# Patient Record
Sex: Female | Born: 1955 | Race: White | Hispanic: No | Marital: Married | Smoking: Never smoker
Health system: Southern US, Community
[De-identification: ages and names within clinical notes are randomized; demographics above are authoritative.]

## PROBLEM LIST (undated history)

## (undated) DIAGNOSIS — M19072 Primary osteoarthritis, left ankle and foot: Secondary | ICD-10-CM

## (undated) DIAGNOSIS — G4733 Obstructive sleep apnea (adult) (pediatric): Secondary | ICD-10-CM

## (undated) DIAGNOSIS — M1711 Unilateral primary osteoarthritis, right knee: Secondary | ICD-10-CM

## (undated) DIAGNOSIS — H9319 Tinnitus, unspecified ear: Secondary | ICD-10-CM

## (undated) DIAGNOSIS — K589 Irritable bowel syndrome without diarrhea: Secondary | ICD-10-CM

## (undated) DIAGNOSIS — IMO0002 Reserved for concepts with insufficient information to code with codable children: Secondary | ICD-10-CM

## (undated) DIAGNOSIS — G43909 Migraine, unspecified, not intractable, without status migrainosus: Secondary | ICD-10-CM

## (undated) DIAGNOSIS — F419 Anxiety disorder, unspecified: Secondary | ICD-10-CM

## (undated) DIAGNOSIS — I6529 Occlusion and stenosis of unspecified carotid artery: Secondary | ICD-10-CM

## (undated) DIAGNOSIS — H04123 Dry eye syndrome of bilateral lacrimal glands: Secondary | ICD-10-CM

## (undated) DIAGNOSIS — M179 Osteoarthritis of knee, unspecified: Secondary | ICD-10-CM

## (undated) DIAGNOSIS — H66003 Acute suppurative otitis media without spontaneous rupture of ear drum, bilateral: Secondary | ICD-10-CM

## (undated) DIAGNOSIS — U071 COVID-19: Secondary | ICD-10-CM

## (undated) DIAGNOSIS — J302 Other seasonal allergic rhinitis: Secondary | ICD-10-CM

## (undated) DIAGNOSIS — M1990 Unspecified osteoarthritis, unspecified site: Secondary | ICD-10-CM

## (undated) DIAGNOSIS — K219 Gastro-esophageal reflux disease without esophagitis: Secondary | ICD-10-CM

## (undated) DIAGNOSIS — D649 Anemia, unspecified: Secondary | ICD-10-CM

## (undated) DIAGNOSIS — Z973 Presence of spectacles and contact lenses: Secondary | ICD-10-CM

## (undated) DIAGNOSIS — Z853 Personal history of malignant neoplasm of breast: Secondary | ICD-10-CM

## (undated) DIAGNOSIS — I773 Arterial fibromuscular dysplasia: Secondary | ICD-10-CM

## (undated) DIAGNOSIS — F439 Reaction to severe stress, unspecified: Secondary | ICD-10-CM

## (undated) DIAGNOSIS — E7800 Pure hypercholesterolemia, unspecified: Secondary | ICD-10-CM

## (undated) DIAGNOSIS — R112 Nausea with vomiting, unspecified: Secondary | ICD-10-CM

## (undated) DIAGNOSIS — S39012A Strain of muscle, fascia and tendon of lower back, initial encounter: Secondary | ICD-10-CM

## (undated) DIAGNOSIS — Q231 Congenital insufficiency of aortic valve: Secondary | ICD-10-CM

## (undated) DIAGNOSIS — Z923 Personal history of irradiation: Secondary | ICD-10-CM

## (undated) DIAGNOSIS — I7771 Dissection of carotid artery: Secondary | ICD-10-CM

## (undated) DIAGNOSIS — B009 Herpesviral infection, unspecified: Secondary | ICD-10-CM

## (undated) DIAGNOSIS — I77810 Thoracic aortic ectasia: Secondary | ICD-10-CM

## (undated) DIAGNOSIS — H6690 Otitis media, unspecified, unspecified ear: Secondary | ICD-10-CM

## (undated) DIAGNOSIS — I10 Essential (primary) hypertension: Secondary | ICD-10-CM

## (undated) DIAGNOSIS — IMO0001 Reserved for inherently not codable concepts without codable children: Secondary | ICD-10-CM

## (undated) DIAGNOSIS — C50919 Malignant neoplasm of unspecified site of unspecified female breast: Secondary | ICD-10-CM

## (undated) DIAGNOSIS — Q2381 Bicuspid aortic valve: Secondary | ICD-10-CM

## (undated) DIAGNOSIS — Z9889 Other specified postprocedural states: Secondary | ICD-10-CM

## (undated) HISTORY — DX: Unspecified osteoarthritis, unspecified site: M19.90

## (undated) HISTORY — DX: Dry eye syndrome of bilateral lacrimal glands: H04.123

## (undated) HISTORY — DX: Personal history of malignant neoplasm of breast: Z85.3

## (undated) HISTORY — DX: Congenital insufficiency of aortic valve: Q23.1

## (undated) HISTORY — PX: WISDOM TOOTH EXTRACTION: SHX21

## (undated) HISTORY — DX: Migraine, unspecified, not intractable, without status migrainosus: G43.909

## (undated) HISTORY — DX: COVID-19: U07.1

## (undated) HISTORY — DX: Irritable bowel syndrome, unspecified: K58.9

## (undated) HISTORY — DX: Primary osteoarthritis, left ankle and foot: M19.072

## (undated) HISTORY — DX: Bicuspid aortic valve: Q23.81

## (undated) HISTORY — DX: Obstructive sleep apnea (adult) (pediatric): G47.33

## (undated) HISTORY — DX: Anxiety disorder, unspecified: F41.9

## (undated) HISTORY — DX: Reserved for concepts with insufficient information to code with codable children: IMO0002

## (undated) HISTORY — DX: Otitis media, unspecified, unspecified ear: H66.90

## (undated) HISTORY — DX: Other seasonal allergic rhinitis: J30.2

## (undated) HISTORY — DX: Strain of muscle, fascia and tendon of lower back, initial encounter: S39.012A

## (undated) HISTORY — DX: Herpesviral infection, unspecified: B00.9

## (undated) HISTORY — DX: Arterial fibromuscular dysplasia: I77.3

## (undated) HISTORY — DX: Gastro-esophageal reflux disease without esophagitis: K21.9

## (undated) HISTORY — DX: Reaction to severe stress, unspecified: F43.9

## (undated) HISTORY — DX: Tinnitus, unspecified ear: H93.19

## (undated) HISTORY — DX: Pure hypercholesterolemia, unspecified: E78.00

## (undated) HISTORY — DX: Acute suppurative otitis media without spontaneous rupture of ear drum, bilateral: H66.003

## (undated) HISTORY — DX: Osteoarthritis of knee, unspecified: M17.9

## (undated) HISTORY — DX: Unilateral primary osteoarthritis, right knee: M17.11

## (undated) HISTORY — DX: Anemia, unspecified: D64.9

## (undated) HISTORY — DX: Essential (primary) hypertension: I10

## (undated) HISTORY — DX: Reserved for inherently not codable concepts without codable children: IMO0001

## (undated) HISTORY — DX: Thoracic aortic ectasia: I77.810

## (undated) HISTORY — PX: BACK SURGERY: SHX140

## (undated) HISTORY — PX: NASAL POLYP SURGERY: SHX186

## (undated) HISTORY — DX: Dissection of carotid artery: I77.71

## (undated) HISTORY — DX: Malignant neoplasm of unspecified site of unspecified female breast: C50.919

---

## 1998-11-12 ENCOUNTER — Other Ambulatory Visit: Admission: RE | Admit: 1998-11-12 | Discharge: 1998-11-12 | Payer: Self-pay | Admitting: Obstetrics & Gynecology

## 1999-12-23 ENCOUNTER — Other Ambulatory Visit: Admission: RE | Admit: 1999-12-23 | Discharge: 1999-12-23 | Payer: Self-pay | Admitting: Obstetrics & Gynecology

## 2000-08-27 ENCOUNTER — Emergency Department (HOSPITAL_COMMUNITY): Admission: EM | Admit: 2000-08-27 | Discharge: 2000-08-27 | Payer: Self-pay | Admitting: *Deleted

## 2000-09-08 ENCOUNTER — Encounter: Admission: RE | Admit: 2000-09-08 | Discharge: 2000-10-18 | Payer: Self-pay | Admitting: Family Medicine

## 2000-11-06 ENCOUNTER — Ambulatory Visit (HOSPITAL_COMMUNITY): Admission: RE | Admit: 2000-11-06 | Discharge: 2000-11-06 | Payer: Self-pay | Admitting: Neurosurgery

## 2000-11-06 ENCOUNTER — Encounter: Payer: Self-pay | Admitting: Neurosurgery

## 2001-05-10 ENCOUNTER — Other Ambulatory Visit: Admission: RE | Admit: 2001-05-10 | Discharge: 2001-05-10 | Payer: Self-pay | Admitting: Obstetrics & Gynecology

## 2001-12-25 ENCOUNTER — Encounter: Payer: Self-pay | Admitting: Obstetrics & Gynecology

## 2001-12-25 ENCOUNTER — Encounter: Admission: RE | Admit: 2001-12-25 | Discharge: 2001-12-25 | Payer: Self-pay | Admitting: Obstetrics & Gynecology

## 2002-03-08 ENCOUNTER — Encounter: Payer: Self-pay | Admitting: Neurology

## 2002-03-08 ENCOUNTER — Ambulatory Visit (HOSPITAL_COMMUNITY): Admission: RE | Admit: 2002-03-08 | Discharge: 2002-03-08 | Payer: Self-pay | Admitting: Neurology

## 2002-03-13 ENCOUNTER — Inpatient Hospital Stay (HOSPITAL_COMMUNITY): Admission: AD | Admit: 2002-03-13 | Discharge: 2002-03-18 | Payer: Self-pay | Admitting: Neurology

## 2002-03-13 ENCOUNTER — Encounter: Payer: Self-pay | Admitting: Neurology

## 2002-04-08 ENCOUNTER — Ambulatory Visit (HOSPITAL_COMMUNITY): Admission: RE | Admit: 2002-04-08 | Discharge: 2002-04-08 | Payer: Self-pay | Admitting: Neurology

## 2002-04-08 ENCOUNTER — Encounter: Payer: Self-pay | Admitting: Neurology

## 2002-05-20 ENCOUNTER — Encounter: Payer: Self-pay | Admitting: Neurology

## 2002-05-20 ENCOUNTER — Ambulatory Visit (HOSPITAL_COMMUNITY): Admission: RE | Admit: 2002-05-20 | Discharge: 2002-05-20 | Payer: Self-pay | Admitting: Neurology

## 2002-06-16 ENCOUNTER — Emergency Department (HOSPITAL_COMMUNITY): Admission: EM | Admit: 2002-06-16 | Discharge: 2002-06-17 | Payer: Self-pay | Admitting: Emergency Medicine

## 2002-08-19 ENCOUNTER — Ambulatory Visit (HOSPITAL_COMMUNITY): Admission: RE | Admit: 2002-08-19 | Discharge: 2002-08-19 | Payer: Self-pay | Admitting: Neurology

## 2003-02-05 ENCOUNTER — Other Ambulatory Visit: Admission: RE | Admit: 2003-02-05 | Discharge: 2003-02-05 | Payer: Self-pay | Admitting: Obstetrics & Gynecology

## 2003-02-28 ENCOUNTER — Ambulatory Visit (HOSPITAL_COMMUNITY): Admission: RE | Admit: 2003-02-28 | Discharge: 2003-02-28 | Payer: Self-pay | Admitting: Interventional Radiology

## 2003-07-23 ENCOUNTER — Encounter: Admission: RE | Admit: 2003-07-23 | Discharge: 2003-07-23 | Payer: Self-pay | Admitting: Obstetrics & Gynecology

## 2003-11-19 ENCOUNTER — Ambulatory Visit (HOSPITAL_COMMUNITY): Admission: RE | Admit: 2003-11-19 | Discharge: 2003-11-19 | Payer: Self-pay | Admitting: Internal Medicine

## 2003-12-12 ENCOUNTER — Ambulatory Visit (HOSPITAL_COMMUNITY): Admission: RE | Admit: 2003-12-12 | Discharge: 2003-12-12 | Payer: Self-pay | Admitting: Internal Medicine

## 2004-07-28 ENCOUNTER — Other Ambulatory Visit: Admission: RE | Admit: 2004-07-28 | Discharge: 2004-07-28 | Payer: Self-pay | Admitting: Obstetrics & Gynecology

## 2004-08-04 ENCOUNTER — Encounter: Admission: RE | Admit: 2004-08-04 | Discharge: 2004-08-04 | Payer: Self-pay | Admitting: Obstetrics & Gynecology

## 2004-08-06 ENCOUNTER — Encounter: Admission: RE | Admit: 2004-08-06 | Discharge: 2004-08-06 | Payer: Self-pay | Admitting: Internal Medicine

## 2004-12-11 ENCOUNTER — Ambulatory Visit (HOSPITAL_COMMUNITY): Admission: RE | Admit: 2004-12-11 | Discharge: 2004-12-11 | Payer: Self-pay | Admitting: Interventional Radiology

## 2005-07-04 HISTORY — PX: COLONOSCOPY: SHX174

## 2005-10-04 ENCOUNTER — Encounter: Admission: RE | Admit: 2005-10-04 | Discharge: 2005-10-04 | Payer: Self-pay | Admitting: Obstetrics & Gynecology

## 2006-02-01 ENCOUNTER — Encounter: Admission: RE | Admit: 2006-02-01 | Discharge: 2006-02-01 | Payer: Self-pay | Admitting: Interventional Radiology

## 2007-01-08 ENCOUNTER — Encounter: Admission: RE | Admit: 2007-01-08 | Discharge: 2007-01-08 | Payer: Self-pay | Admitting: Obstetrics & Gynecology

## 2008-01-23 ENCOUNTER — Encounter: Admission: RE | Admit: 2008-01-23 | Discharge: 2008-01-23 | Payer: Self-pay | Admitting: Obstetrics & Gynecology

## 2008-02-04 ENCOUNTER — Encounter: Admission: RE | Admit: 2008-02-04 | Discharge: 2008-02-04 | Payer: Self-pay | Admitting: Obstetrics & Gynecology

## 2009-04-23 ENCOUNTER — Encounter: Admission: RE | Admit: 2009-04-23 | Discharge: 2009-04-23 | Payer: Self-pay | Admitting: Obstetrics & Gynecology

## 2010-05-14 ENCOUNTER — Encounter: Admission: RE | Admit: 2010-05-14 | Discharge: 2010-05-14 | Payer: Self-pay | Admitting: Obstetrics & Gynecology

## 2010-07-25 ENCOUNTER — Encounter: Payer: Self-pay | Admitting: Interventional Radiology

## 2010-07-26 ENCOUNTER — Encounter: Payer: Self-pay | Admitting: Obstetrics & Gynecology

## 2010-11-19 NOTE — Op Note (Signed)
[place]  Patient:    [name], [name]                       MRN: [id number] Proc. Date: 11/06/00 Adm. Date:  [id number] Attending:  Donn Pierini                           Operative Report  PREOPERATIVE DIAGNOSIS:  Left L2-3 extraforaminal herniated nucleus pulposus with radiculopathy.  POSTOPERATIVE DIAGNOSIS:  Left L2-3 extraforaminal herniated nucleus pulposus with radiculopathy.  OPERATION:   Left L2-3 extraforaminal microdiskectomy.  SURGEON:  Julio Sicks, M.D.  ASSISTANT:  Reinaldo Meeker, M.D.  ANESTHESIA:  General endotracheal anesthesia.  INDICATIONS:  Ms. Skop is a 55 year old female with history of back and left lower extremity pain consistent with a left-sided L2 radiculopathy.  The patient has failed efforts at conservative management.  She has evidence of weakness and numbness and left-sided L2 radicular pattern.   MRI scanning demonstrates a left-sided L2-3 extraforaminal disk herniation with compression of the left-sided L2 nerve root.  The patient has been counselled as to her options.  She has decided to proceed with a left-sided L2-3 extraforaminal microdiskectomy for hopeful relief of her symptoms.  DESCRIPTION OF PROCEDURE:  The patient was taken to the operating room and placed on operating table in supine position.  After an adequate level of anesthesia was achieved, the patient was turned prone onto Wilson frame and padded adequately.  The patients lumbar region is shaved and prepped sterilely.   A #10 blade is used to make a linear incision overlying the L2-3 interspace.  The skin is entered sharply in the midline.  A subperiosteal dissection was performed on the left side exposing lamina and facet joints of L2 and L3 as well as transverse process at L2.  A deep self-retaining retractor was placed.  X-rays taken and level was confirmed.  An extraforaminal approach was then performed using high-speed drill to  remove the lateral aspect of the inferior facet of L2 and the superior facet of L3. Intertransverse ligament was then elevated and resected in piecemeal fashion. The underlying L2 nerve root was identified.  The operating microscope was brought into the field and used for microdissection of the left-sided L2 nerve root and underlying disk herniation.   Paraneural fat was then coagulated and cut.  The L2 nerve root was gradually mobilized superiorly, and a large amount of free disk herniation was identified laterally.  This was dissected free using blunt hooks.  This was then removed using pituitary rongeurs.  A very thorough microdiskectomy was carried out at this point.  There was no evidence of any continued compression.  The wound was then copiously irrigated with antibiotic solution.  Gelfoam was placed topically for hemostasis which was found to be good.  The wound was then closed in layers with Vicryl sutures. Steri-Strips and sterile dressing were applied.  There were no intraoperative complications.  The patient tolerated the procedure well, and she returned to the recovery room postoperatively. DD:  11/06/00 TD:  11/06/00 Job: [id number]

## 2010-11-19 NOTE — Discharge Summary (Signed)
[name], [name]                          ACCOUNT NO.:  [id number]   MEDICAL RECORD NO.:  [id number]                   PATIENT TYPE:  INP   LOCATION:  3001                                 FACILITY:  [facility]   PHYSICIAN:  Brianna Peer. Polite, MD                DATE OF BIRTH:  August 12, 1955   DATE OF ADMISSION:  03/13/2002  DATE OF DISCHARGE:  03/18/2002                                 DISCHARGE SUMMARY   DISCHARGE DIAGNOSES:  1. 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  2. Chronic headaches.  3. Hyperlipidemia.  4. Decreased vision without field deficit.  5. History of back surgery.   DISCHARGE MEDICATIONS:  1. Zocor 20 mg at bedtime.  2. Celebrex 200 mg daily.  3. Coumadin 7.5 mg daily.  4. Protonix 40 mg daily.  5. Xanax 0.5 mg three times a day as needed for anxiety.  6. Darvocet-N 100 one to two times every four hours as needed for pain.   CONSULTATIONS:  1. Dr. Porfirio Mylar Torres, neurology.  2. Dr. Grandville Silos. Torres, interventional radiologist.   PROCEDURES AND STUDIES:  1. Cerebral arteriogram which was done on September 10th.  Results reveals:     1) Severe segmental stenosis, smooth in nature, involving the distal     right internal carotid artery just proximal to the petrous-cervical     junction.  This is highly suspicious of relatively recent dissection,     correlating with the patient's symptoms.  2)  No evidence of intraluminal     filling defects to suggest clot formation in this region, although there     is reduced hemodynamic flow through this vessel.  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  4)  Obfuscation of the     right anterior communicating artery distribution via the anterior     communicating artery  from the left internal carotid artery. 5)     Approximately 85% stenosis of the left internal carotid artery just     proximal to the petrous-cervical junction associated with a moderate-     sized pseudoaneurysm; this finding is most compatible with a dissection,     also probably slightly older than the one on the right.  6)  Incidental     note is made of focal stenosis of 50% of the right vertebral artery at     the cranial skull base.  7)  The finding in the internal carotid artery     distally at the cranial skull base and the right vertebral artery raise     the possibility of an underlying fibromuscular dysplasia.  2. She had an electrocardiogram, September  10th:  Normal sinus rhythm,     ventricular rate 60.   LABORATORY DATA:  Admission CBC with a hemoglobin of 13.6, hematocrit 40.2,  otherwise, within normal limits.  ESR was 7.  Chemistries with a glucose of  114, ALT 51, otherwise, within normal limits.  Lipid profile:  Total  cholesterol 210, triglycerides 199.  TSH 1.478.  Discharge CBC with a WBC  count of 6.9, RBC 4.47, hemoglobin 13.1, hematocrit 39.9.  ANA was pending.   DISPOSITION:  The patient will be discharged home.   HISTORY OF PRESENT ILLNESS:  This is a 55 year old female who is followed in  primary care by Dr. Caryn Bee C. Torres.  The patient has had chronic  headaches refractory to conservative treatment.  She was referred to Dr.  Rene Torres of the headache and neck pain clinic for evaluation.  Neurological exam revealed cranial bruits.  The patient was sent to [facility] for a cerebral angiogram on March 13, 2002, results revealing  bilateral carotid stenoses, questionable pseudoaneurysms/dissection.  The  patient was admitted for further evaluation and treatment.   HOSPITAL COURSE:  1. BILATERAL INTERNAL CAROTID ARTERY STENOSES, QUESTIONABLE PSEUDOANEURYSM     OR QUESTIONABLE DISSECTION:  The patient was admitted, started on IV     heparin and  p.o. Coumadin.  She was followed by the interventional     radiologist.  The pharmacy followed her PT/INRs.  She continued to     complains of headaches that were treated with analgesics.  The patient     did not display any neurological deficits.  A neurological consult was     made.  She was seen on March 17, 2002 by Dr. 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  Recommendations were continue     anticoagulants, with a goal INR to 2 to 3, to treat her headaches with     anti-inflammatory medications.  The patient should not have any triptans     as vasospastic agents could cause the patient to suffer a CVA, future     recommendations for possible stent in the intracerebral artery, should     there be a persistent narrowing.  She is to have a repeat angiogram on     October 6th by interventional radiology and she is to follow up with Dr.     Porfirio Mylar Torres on an outpatient basis.   1. CHRONIC HEADACHES:  These were managed with p.o. analgesics.  The patient     is being discharged on p.o. analgesics.  Further treatment will be left     to primary care physician and/or a neurologist.   1. HYPERLIPIDEMIA:  Due to vascular disease, the patient was started on     Zocor.  Further treatment and management will be left to her primary care     physician.   1. DIMINISHED VISION WITHOUT VISUAL FIELD DEFECT:  The patient is to follow     up with Dr. Molly Maduro L. Torres for ophthalmologic evaluation.   1. ANTICOAGULANT THERAPY:  The patient should have a PT/INR at her next     office visit with Dr. Sydnee Torres on September 29th.   FOLLOWUP:  Again, the patient is to have a repeat angiogram, October 6th,  and she was to follow up within three weeks with Dr.  Porfirio Mylar Torres, along with an ophthalmologic evaluation by Dr. Dione Torres.  She  has an appointment with  her primary care physician, Dr. Sydnee Torres, on September 29th.  An update was  left for Dr. Sydnee Torres on his office voice mail.     Stephanie Swaziland, NP                      Brianna Peer. Polite, MD    SJ/MEDQ  D:  03/18/2002  T:  03/19/2002  Job:  [id number]   cc:   Lilyan Punt. [name], M.D.   Melvyn Novas, M.D.  [address]  [place]  [place] [address]  Fax: [phone]   Brianna Silos. Corliss Skains, M.D.  [address]  [place]  [place]  [address]  Fax: [phone]   [name], M.D.  [address]  [place]  [place] [address]  Fax: [phone]

## 2010-11-19 NOTE — Consult Note (Signed)
NAME:  [name], [name]                          ACCOUNT NO.:  [id number]   MEDICAL RECORD NO.:  [id number]                   PATIENT TYPE:  INP   LOCATION:  3001                                 FACILITY:  [facility]   PHYSICIAN:  Melvyn Novas, M.D.               DATE OF BIRTH:  [birth year]   DATE OF CONSULTATION:  03/17/2002  DATE OF DISCHARGE:                                   CONSULTATION   NEUROLOGY CONSULTATION   REASON FOR CONSULTATION:  The patient is admitted to Room 3001 at [facility].  This 55 year old Caucasian right-handed female has no significant  past medical history except for an L2-3 disk prolapse and diskectomy surgery  in 2002 which had temporarily effected the strength and sensory in her left  leg.  She has a history of juvenile arrhythmia that was treated with  Tenormin throughout her early 39s and during her first of three pregnancies  was discontinued.  She has never required any cardiac arrhythmia medication  again.  She had normal pregnancies and deliveries and denies any gestational  diabetes or hypertension or eclamptic problems.  She has occasional  migraines since teenager and recently developed some tinnitus and recurrent  headaches of a strength and quality that she had not encountered before.  She saw Dr. Ladona Horns as an outpatient headache specialist who arranged for an  angiogram through Dr. Pollyann Savoy, a rheumatologist, and her husband  Kerby Nora, he is an interventional radiologist.  Dr. Corliss Skains was also  contacted because the patient's ENT physician who had worked her up for a  tinnitus has recommended angio as well.  On the angiogram there were  bilateral carotid artery stenoses seen, they appeared to have a thickening  of the wall, no true aneurysm was seen and it was thought that these were  subacute or chronic dissections of the arteries remote in occurrence that  had partially healed that might have been a rather newer  dissective  component to the right internal carotid artery.  Again, tinnitus and  pulsatory are the choice phenomena perceived on left.  The patient then  acutely developed a right-sided blurring of vision and felt that her eyelids  looked abnormal.  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  This is day #4 of her hospital admission, today's INR  was 1.8, and she is supposed to be able to be discharged tomorrow.  She  requested from Dr. Nehemiah Settle to see a neurologist for an evaluation as her  current specialist is not associated with hospital care and she would like  to have a more comprehensive approach.  It was decided that the patient most  likely suffers from fibromuscular dysplasia and she  was worked up for this  condition.   FAMILY HISTORY:  She has a daughter 24 years old with migraines that had  started as teenager.  She had two parents that both suffered from strokes  when over 52 years old, father had hypertension.  The daughter also has a  benign bone tumor, giant cell, of the right wrist and this required surgical  treatment by bone graft.  She also has a peripheral scoliosis.   SOCIAL HISTORY:  The patient is married and first marriage with three  children 51 years old, 30 years old, and 55 years old.  Nonsmoker,  nondrinker.  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   MEDICAL TREATMENT:  Coumadin at the current 7.5 mg, one half tablet.  IV  heparin.  The patient temporarily used Cerebyx prior to her admission for a  brief of time as prescribed by Dr. Corliss Skains 200 mg q.a.m.  She has p.r.n.  Xanax 0.5 to 1 mg for insomnia or anxiety.  She uses Protonix CF 40 mg q.d.  and she has a p.r.n. order for Darvocet 100 mg.   ALLERGIES:  She has no known drug  allergies.   REVIEW OF SYSTEMS:  The patient still complains of headache.  The Horner  symptoms have decreased but there is a feeling of slight puffiness over her  right face and she still has noticed decrease in visual acuity, blurring,  but no visual field defect or decrease in color vision.   PHYSICAL EXAMINATION:  VITAL SIGNS: The patient is afebrile, blood pressure  is 120/70, heart rate 68, temperature 97.  LUNGS: Clear to auscultation.  HEART: Regular rhythm without murmur.  ABDOMEN: Soft, nontender.  There is no peripheral edema.  PULSES: All pulses are palpable.  EXTREMITIES/SKIN: No erythema, depigmentation or hyperpigmentation of  fibrous dermatomal lesions are seen.  NECK: The patient has a full range of motion in the neck.  NEUROLOGICAL: Cranial nerves: The patient has a pupillary reaction to light  only on the left promptly from 4/2 mm, on the right there is an absent  direct light reaction.  I feel that there is a mild consensual reaction but  certainly not intact to the full level.  Accommodation is intact.  The  patient has increasing headaches when accommodation maneuver is performed.  The facial symmetry seems preserved.  Tongue and uvula are midline.  Extraocular movements are fully intact.  The patient can close her eyes,  wrinkle her forehead and smile bilaterally symmetric.  __________ and  Beevor's no lateralization.  The patient adds that she has lost her taste  for sweets with the onset of the Horner and wanted to know if there was a  gustatory component possible which I could verify.  Motor exam 5/5 strength,  mass and tone bilaterally.  Finger-nose bilaterally equally good without  dysmetria, ataxia, or tremor.  Rapid alternating movements are fully intact.  The coordination appears intact.  The patient has intact sensory to touch,  pinprick, vibration,  and temperature.  She has equal DTRs 1+ with upgoing toe only on the left and here she states this might  be a finding from her  lumbar surgery since her left leg was most effected.  She has fully balanced  gait and stance.  There is no nystagmus with rapid movements of the neck.  I  repeated the peripheral vision test and it shows no peripheral vision loss,  again  there is no nystagmus and the extraocular movements are fully intact.  Capacity of the patient to read is intact.  When used binocular/monocular  vision, the right seems to be decreased to 20/100.  I cannot appreciate the  retina but I have no dilatory eye drops available here.  The patient's  pupils is about 2 mm wide.   ASSESSMENT:  Fibromuscular dysplasia with multiple recurrent dissections and  involvement of sympathetic fibers along the carotid glomus, internal carotid  artery and ophthalmic artery.  Cannot clearly explain tinnitus with that.  The patient has no true sign of an old stroke.   PLAN:  Continue INR followup goal rate 2-3.  We would be able to treat the  headaches with anti-inflammatory medications as opposed to steroids here in  the hospital or Celebrex, Vioxx or Bextra which should be started here to  see if it effects the patient's INR and Coumadin management.  For  prophylactic medication, it would be necessary to rule out a vasovascular  headache provoked by the fibromuscular dysplasia and that the headaches  occur more than six times a month which would be the cutoff line to make  preventive medications necessary.  I did not get that story.  I would also  like to remind that the patient should not have any triptans as vasospastic  agents could trip the cause a fibromuscular dysplasia patient to suffer a  stroke.  I would consider continuous Coumadin treatment even for multiple  years to come and perhaps at a later point when it is clear that the disease  has cooled off to place stents in the intracerebral arteries should there  be a persistent narrowing by an organized structure.  I thank Dr. Nehemiah Settle  very  much for this consultation and would like to see the patient in follow  up 2-3 weeks post discharge.  I also recommended strongly to the patient to  see an ophthalmologist for an evaluation of the visual acuity loss and if  she had involvement of macular fibers by ischemia secondary to the  dysplasia.  This should be documented and perhaps a retinal photograph  obtained.                                               Melvyn Novas, M.D.   CD/MEDQ  D:  03/17/2002  T:  03/17/2002  Job:  [id number]

## 2011-04-13 ENCOUNTER — Other Ambulatory Visit: Payer: Self-pay | Admitting: Obstetrics & Gynecology

## 2011-04-13 DIAGNOSIS — Z1231 Encounter for screening mammogram for malignant neoplasm of breast: Secondary | ICD-10-CM

## 2011-05-16 ENCOUNTER — Ambulatory Visit
Admission: RE | Admit: 2011-05-16 | Discharge: 2011-05-16 | Disposition: A | Discharge status: Home / Self Care | Payer: Commercial Indemnity | Source: Ambulatory Visit | Attending: Obstetrics & Gynecology | Admitting: Obstetrics & Gynecology

## 2011-05-16 DIAGNOSIS — Z1231 Encounter for screening mammogram for malignant neoplasm of breast: Secondary | ICD-10-CM

## 2011-05-24 ENCOUNTER — Other Ambulatory Visit: Payer: Self-pay | Admitting: Obstetrics & Gynecology

## 2011-05-24 DIAGNOSIS — R928 Other abnormal and inconclusive findings on diagnostic imaging of breast: Secondary | ICD-10-CM

## 2011-06-07 ENCOUNTER — Ambulatory Visit
Admission: RE | Admit: 2011-06-07 | Discharge: 2011-06-07 | Disposition: A | Discharge status: Home / Self Care | Payer: Commercial Indemnity | Source: Ambulatory Visit | Attending: Obstetrics & Gynecology | Admitting: Obstetrics & Gynecology

## 2011-06-07 DIAGNOSIS — R928 Other abnormal and inconclusive findings on diagnostic imaging of breast: Secondary | ICD-10-CM

## 2012-07-04 DIAGNOSIS — Z923 Personal history of irradiation: Secondary | ICD-10-CM

## 2012-07-04 HISTORY — DX: Personal history of irradiation: Z92.3

## 2012-10-31 ENCOUNTER — Other Ambulatory Visit: Payer: Self-pay

## 2012-10-31 DIAGNOSIS — Z1231 Encounter for screening mammogram for malignant neoplasm of breast: Secondary | ICD-10-CM

## 2012-11-28 ENCOUNTER — Ambulatory Visit
Admission: RE | Admit: 2012-11-28 | Discharge: 2012-11-28 | Disposition: A | Discharge status: Home / Self Care | Payer: Commercial Indemnity | Source: Ambulatory Visit

## 2012-11-28 DIAGNOSIS — Z1231 Encounter for screening mammogram for malignant neoplasm of breast: Secondary | ICD-10-CM

## 2012-11-29 ENCOUNTER — Other Ambulatory Visit: Payer: Self-pay | Admitting: Obstetrics & Gynecology

## 2012-11-29 DIAGNOSIS — R928 Other abnormal and inconclusive findings on diagnostic imaging of breast: Secondary | ICD-10-CM

## 2012-12-07 ENCOUNTER — Ambulatory Visit
Admission: RE | Admit: 2012-12-07 | Discharge: 2012-12-07 | Disposition: A | Discharge status: Home / Self Care | Payer: Commercial Indemnity | Source: Ambulatory Visit | Attending: Obstetrics & Gynecology | Admitting: Obstetrics & Gynecology

## 2012-12-07 ENCOUNTER — Other Ambulatory Visit: Payer: Self-pay | Admitting: Obstetrics & Gynecology

## 2012-12-07 DIAGNOSIS — R928 Other abnormal and inconclusive findings on diagnostic imaging of breast: Secondary | ICD-10-CM

## 2012-12-12 ENCOUNTER — Ambulatory Visit
Admission: RE | Admit: 2012-12-12 | Discharge: 2012-12-12 | Disposition: A | Discharge status: Home / Self Care | Payer: Commercial Indemnity | Source: Ambulatory Visit | Attending: Obstetrics & Gynecology | Admitting: Obstetrics & Gynecology

## 2012-12-12 DIAGNOSIS — R928 Other abnormal and inconclusive findings on diagnostic imaging of breast: Secondary | ICD-10-CM

## 2012-12-13 ENCOUNTER — Other Ambulatory Visit: Payer: Self-pay | Admitting: Obstetrics & Gynecology

## 2012-12-13 DIAGNOSIS — C50912 Malignant neoplasm of unspecified site of left female breast: Secondary | ICD-10-CM

## 2012-12-14 ENCOUNTER — Telehealth: Payer: Self-pay | Admitting: *Deleted

## 2012-12-14 DIAGNOSIS — C50419 Malignant neoplasm of upper-outer quadrant of unspecified female breast: Secondary | ICD-10-CM | POA: Insufficient documentation

## 2012-12-14 DIAGNOSIS — C50412 Malignant neoplasm of upper-outer quadrant of left female breast: Secondary | ICD-10-CM

## 2012-12-14 NOTE — Telephone Encounter (Signed)
Confirmed [facility] for 12/19/12 at 0800.  Instructions and contact information given.

## 2012-12-18 ENCOUNTER — Ambulatory Visit
Admission: RE | Admit: 2012-12-18 | Discharge: 2012-12-18 | Disposition: A | Discharge status: Home / Self Care | Payer: Commercial Indemnity | Source: Ambulatory Visit | Attending: Obstetrics & Gynecology | Admitting: Obstetrics & Gynecology

## 2012-12-18 ENCOUNTER — Other Ambulatory Visit: Payer: Self-pay | Admitting: Obstetrics & Gynecology

## 2012-12-18 DIAGNOSIS — C50912 Malignant neoplasm of unspecified site of left female breast: Secondary | ICD-10-CM

## 2012-12-18 MED ORDER — GADOBENATE DIMEGLUMINE 529 MG/ML IV SOLN
16.0000 mL | Freq: Once | INTRAVENOUS | Status: AC | PRN
  Administered 2012-12-18: 16 mL via INTRAVENOUS

## 2012-12-19 ENCOUNTER — Ambulatory Visit
Admission: RE | Admit: 2012-12-19 | Discharge: 2012-12-19 | Disposition: A | Discharge status: Home / Self Care | Payer: Commercial Indemnity | Source: Ambulatory Visit | Attending: Radiation Oncology | Admitting: Radiation Oncology

## 2012-12-19 ENCOUNTER — Ambulatory Visit: Payer: Commercial Indemnity

## 2012-12-19 ENCOUNTER — Other Ambulatory Visit: Payer: Self-pay | Admitting: Oncology

## 2012-12-19 ENCOUNTER — Ambulatory Visit (HOSPITAL_BASED_OUTPATIENT_CLINIC_OR_DEPARTMENT_OTHER): Payer: Commercial Indemnity | Admitting: Surgery

## 2012-12-19 ENCOUNTER — Telehealth: Payer: Self-pay | Admitting: Oncology

## 2012-12-19 ENCOUNTER — Other Ambulatory Visit (HOSPITAL_BASED_OUTPATIENT_CLINIC_OR_DEPARTMENT_OTHER): Payer: Commercial Indemnity | Admitting: Lab

## 2012-12-19 ENCOUNTER — Ambulatory Visit (HOSPITAL_BASED_OUTPATIENT_CLINIC_OR_DEPARTMENT_OTHER): Payer: Commercial Indemnity | Admitting: Oncology

## 2012-12-19 ENCOUNTER — Encounter: Payer: Self-pay | Admitting: *Deleted

## 2012-12-19 ENCOUNTER — Ambulatory Visit: Payer: Commercial Indemnity | Attending: Surgery | Admitting: Physical Therapy

## 2012-12-19 ENCOUNTER — Encounter: Payer: Self-pay | Admitting: Oncology

## 2012-12-19 ENCOUNTER — Other Ambulatory Visit (INDEPENDENT_AMBULATORY_CARE_PROVIDER_SITE_OTHER): Payer: Self-pay | Admitting: Surgery

## 2012-12-19 VITALS — BP 149/90 | HR 82 | Temp 98.2°F | Resp 20 | Ht 64.5 in | Wt 175.7 lb

## 2012-12-19 DIAGNOSIS — Z01812 Encounter for preprocedural laboratory examination: Secondary | ICD-10-CM | POA: Insufficient documentation

## 2012-12-19 DIAGNOSIS — C50412 Malignant neoplasm of upper-outer quadrant of left female breast: Secondary | ICD-10-CM

## 2012-12-19 DIAGNOSIS — C50419 Malignant neoplasm of upper-outer quadrant of unspecified female breast: Secondary | ICD-10-CM

## 2012-12-19 DIAGNOSIS — C50919 Malignant neoplasm of unspecified site of unspecified female breast: Secondary | ICD-10-CM | POA: Insufficient documentation

## 2012-12-19 DIAGNOSIS — IMO0001 Reserved for inherently not codable concepts without codable children: Secondary | ICD-10-CM | POA: Insufficient documentation

## 2012-12-19 DIAGNOSIS — R293 Abnormal posture: Secondary | ICD-10-CM | POA: Insufficient documentation

## 2012-12-19 DIAGNOSIS — C50912 Malignant neoplasm of unspecified site of left female breast: Secondary | ICD-10-CM

## 2012-12-19 LAB — CBC WITH DIFFERENTIAL/PLATELET
BASO%: 1.9 % (ref 0.0–2.0)
Eosinophils Absolute: 0.1 10*3/uL (ref 0.0–0.5)
HCT: 43 % (ref 34.8–46.6)
HGB: 14.6 g/dL (ref 11.6–15.9)
MCH: 30.1 pg (ref 25.1–34.0)
MONO%: 8.2 % (ref 0.0–14.0)
Platelets: 208 10*3/uL (ref 145–400)
RBC: 4.85 10*6/uL (ref 3.70–5.45)
RDW: 13.4 % (ref 11.2–14.5)

## 2012-12-19 LAB — COMPREHENSIVE METABOLIC PANEL (CC13)
ALT: 25 U/L (ref 0–55)
AST: 18 U/L (ref 5–34)
Albumin: 3.8 g/dL (ref 3.5–5.0)
Alkaline Phosphatase: 71 U/L (ref 40–150)
Glucose: 78 mg/dl (ref 70–99)
Potassium: 3.9 mEq/L (ref 3.5–5.1)
Sodium: 141 mEq/L (ref 136–145)
Total Bilirubin: 0.61 mg/dL (ref 0.20–1.20)
Total Protein: 7.2 g/dL (ref 6.4–8.3)

## 2012-12-19 NOTE — Progress Notes (Signed)
Checked in new patient. No financial issues. She has no POA/living will. She want all communication via email and she will do mchart. I gave her the [facility] form to fill out.

## 2012-12-19 NOTE — Telephone Encounter (Signed)
, °

## 2012-12-19 NOTE — Progress Notes (Addendum)
Re:   [name] DOB:   December 09, 1955 MRN:   [id number]  [facility]  ASSESSMENT AND PLAN: 1.  Left breast cancer - at 1 o'clock  1.5 cm on mammogram, 2.0 cm on MRI  IDC, Grade 2, ER +, PR +, Her2Neu - neg., Ki67 - 10%.  Oncology - Magrinat and Michell Heinrich.  I discussed the options for breast cancer treatment with the patient.  The patient is in the multidisciplinary clinic and understands the treatment of breast cancer includes medical oncology and radiation oncology.  I discussed the surgical options of lumpectomy vs. mastectomy.  If mastectomy, there is the possibility of reconstruction.   I discussed the options of lymph node biopsy.  The treatment plan depends on the pathologic staging of the tumor and the patient's personal wishes.  The risks of surgery include, but are not limited to, bleeding, infection, the need for further surgery, and nerve injury.  Plan: 1) left breast lumpectomy and left SLNBx, 2.)  Oncotype  2.  History of int carotid stenosis (secondary to fibromuscular dysplasia) - she underwent carotid artery dilatation in 2003.  Drs. Dohmeir and Tour manager on chronic aspirin for this  [Okay from Dr. Richardean Chimera to stop aspirin for 5 days prior to surgery.  DN 12/23/2012] 3.  On chronic aspirin for #2.  She is going to check with Dr. Vickey Huger about stopping the aspirin in anticipation of surgery 4.  Recent rectal bleeding - secondary to hemorrhoids.  Will check in the office - did not have the right supplies at the [facility]. 5.  Hypercholesterolemia   REFERRING PHYSICIAN: [name], MD  HISTORY OF PRESENT ILLNESS: [name] is a 57 y.o. (DOB: [birth year])  white  female whose primary care physician is Brianna Mountain, MD and comes to [facility] for a new left breast cancer.  The patient went for her routine mammogram.  Her last mammogram was about 15 months ago.  The mammogram at [facility] showed a 1.5 x 1.3 cm mass at the 1 o'clock position.  A  core biopsy showed an Invasive Ductal Carcinoma.  A breast MRI on 12/18/2012 showed a 2.0 x 1.4 cm mass in the UOQ of the left breast. She has a cousin who had breast cancer, but no first degree relative.  She went 9 months without a period, then recently had a small period.  She has seen Dr. Rosealee Torres for this.  He is checking some blood work to see if she has gone through menopause.  She is not on hormone meds.   No past medical history on file.    Past Surgical History  Procedure Laterality Date  . Back surgery        Current Outpatient Prescriptions  Medication Sig Dispense Refill  . aspirin 81 MG tablet Take 81 mg by mouth daily.      Marland Kitchen atorvastatin (LIPITOR) 10 MG tablet Take 10 mg by mouth daily.      . Calcium Carbonate-Vitamin D (CALCIUM + D PO) Take by mouth daily.      . citalopram (CELEXA) 10 MG tablet Take 10 mg by mouth daily.      . Fluticasone Propionate (FLONASE NA) Place into the nose.      . Multiple Vitamins-Minerals (CENTRUM SILVER ADULT 50+ PO) Take by mouth.       No current facility-administered medications for this visit.     No Known Allergies  REVIEW OF SYSTEMS: Skin:  No history of rash.  No history of abnormal moles. Infection:  No history of hepatitis or HIV.  No history of MRSA. Neurologic:  Prior carotid artery dilatation around 2002.  Followed by Dr. Vickey Huger. Cardiac:  No history of hypertension. No history of heart disease.  No history of seeing a cardiologist. Pulmonary:  Does not smoke cigarettes.  No asthma or bronchitis.  No OSA/CPAP.  Endocrine:  No diabetes. No thyroid disease.  Hypercholesterolemia. Gastrointestinal:  No history of stomach disease.  No history of liver disease.  No history of gall bladder disease.  No history of pancreas disease.  No history of colon disease.  Some rectal bleeding attributed to hemorrhoids.  Negative colonoscopy at age 55 (done at [facility], but she cannot remember the name of the GI doctor). Urologic:  No history of  kidney stones.  No history of bladder infections. Musculoskeletal:  Back surgery, Dr. Dutch Quint, 11/2000 Hematologic:  No bleeding disorder. On aspirin. Psycho-social:  The patient is oriented.   The patient has no obvious psychologic or social impairment to understanding our conversation and plan.  SOCIAL and FAMILY HISTORY: Married. Husband and daughter, [name], with patient. Husband had lymphoma treated by Dr. Gaylyn Rong.  Someone in my group did the original abdominal biopsy.  PHYSICAL EXAM: There were no vitals taken for this visit.  General: WN WF who is alert and generally healthy appearing.  HEENT: Normal. Pupils equal. Neck: Supple. No mass.  No thyroid mass. Lymph Nodes:  No supraclavicular or cervical nodes. Lungs: Clear to auscultation and symmetric breath sounds. Breasts:  Right - unremarkable  Left - Bruise at edge of areola, about 2 o'clock.  No mass or skin changes.  Heart:  RRR. No murmur or rub. Abdomen: Soft. No mass. No tenderness. No hernia. Normal bowel sounds.  No abdominal scars. Rectal: No external hemorrhoids.  No lubricant to do rectal. Extremities:  Good strength and ROM  in upper and lower extremities. Neurologic:  Grossly intact to motor and sensory function. Psychiatric: Has normal mood and affect. Behavior is normal.   DATA REVIEWED: Epic and notes in chart.  Ovidio Kin, MD,  [facility] [address] [place]  [address]   [place], [place]    [address] Phone:  [phone] FAX:  [phone]

## 2012-12-19 NOTE — Progress Notes (Addendum)
Radiation Oncology         [phone]) [phone] ________________________________  Initial outpatient Consultation - Date: 12/19/2012   Name: [name] MRN: [id number]   DOB: April 09, 1956  REFERRING PHYSICIAN: Kandis Cocking, MD  DIAGNOSIS: T1cN0 left breast cancer  HISTORY OF PRESENT ILLNESS::[name] is a 57 y.o. female  who underwent routine mammography on may 28 suggesting and abnormality in the left breast. Followup ultrasound on June 6 confirmed a 1.5 cm mass in the upper outer quadrant of the left breast. 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 She was referred for MRI which showed a 1.3 x 1.4 x 2.0 cm mass in the left breast. The right breast is negative and no enlarged lymph nodes were noted. She is asymptomatic from this mass. She is GXP3. She has occasional rectal bleeding from your pain and pain from her biopsy. She has a family history of the mother with endometrial cancer a cousin with breast cancer a grandfather with the brain tumor and a daughter with melanoma in situ.  PREVIOUS RADIATION THERAPY: No  PAST MEDICAL HISTORY:  has no past medical history on file.    PAST SURGICAL HISTORY: Past Surgical History  Procedure Laterality Date  . Back surgery      FAMILY HISTORY:  Family History  Problem Relation Age of Onset  . Uterine cancer Mother   . Brain cancer Paternal Grandfather   . Breast cancer Cousin   . Melanoma Daughter     SOCIAL HISTORY:  History  Substance Use Topics  . Smoking status: Never Smoker   . Smokeless tobacco: Not on file  . Alcohol Use: No    ALLERGIES: Review of patient's allergies indicates no known allergies.  MEDICATIONS:  Current Outpatient Prescriptions  Medication Sig Dispense Refill  . aspirin 81 MG tablet Take 81 mg by mouth daily.      Marland Kitchen atorvastatin (LIPITOR) 10 MG tablet Take 10 mg by mouth daily.      . Calcium Carbonate-Vitamin D (CALCIUM +  D PO) Take by mouth daily.      . citalopram (CELEXA) 10 MG tablet Take 10 mg by mouth daily.      . Fluticasone Propionate (FLONASE NA) Place into the nose.      . Multiple Vitamins-Minerals (CENTRUM SILVER ADULT 50+ PO) Take by mouth.       No current facility-administered medications for this encounter.    REVIEW OF SYSTEMS:  A 15 point review of systems is documented in the electronic medical record. This was obtained by the nursing staff. However, I reviewed this with the patient to discuss relevant findings and make appropriate changes.  Pertinent items are noted in HPI.   PHYSICAL EXAM: There were no vitals filed for this visit.. . He is a pleasant female sitting comfortably on examining room table. She has no palpable cervical or subclavicular adenopathy. No palpable axillary adenopathy bilaterally. She has some very faint biopsy change in the left breast with some minimal bruising. No palpable abnormalities the right breast. She is alert and oriented x3. She has 5 out of 5 strength bilaterally.  LABORATORY DATA:  Lab Results  Component Value Date   WBC 5.9 12/19/2012   HGB 14.6 12/19/2012   HCT 43.0 12/19/2012   MCV 88.6 12/19/2012   PLT 208 12/19/2012   Lab Results  Component Value Date   NA 141 12/19/2012   K 3.9  12/19/2012   CL 107 12/19/2012   CO2 24 12/19/2012   Lab Results  Component Value Date   ALT 25 12/19/2012   AST 18 12/19/2012   ALKPHOS 71 12/19/2012   BILITOT 0.61 12/19/2012     RADIOGRAPHY: US Breast Left  12/07/2012   *RADIOLOGY REPORT*  Clinical Data:  The patient returns following tomographic screening for evaluation of the left breast.  DIGITAL DIAGNOSTIC LEFT MAMMOGRAM  AND LEFT BREAST ULTRASOUND:  Comparison:  11/28/2012 and earlier  Findings:  ACR Breast Density Category 3: The breast tissue is heterogeneously dense.  Spot compression views are performed, confirming presence of distortion associated with density in the upper outer quadrant of the left breast.  On  physical exam, I palpate no abnormality in the upper-outer quadrant of the left breast.  Ultrasound is performed, showing an irregular, hypoechoic lesion with angular margins and acoustic shadowing in the 1 o'clock location of the left breast, 3 cm from the nipple.  Mass measures 1.5 x 0.9 x 1.3 cm.  There is internal blood flow on Doppler evaluation.  Evaluation of the left axilla is negative for adenopathy.  IMPRESSION:  1.  Suspicious mass in the 1 o'clock location of the left breast. 2.  No evidence for adenopathy. 3.  The patient has a history of internal carotid artery dissection and takes 325 mg of aspirin daily.  This will not be discontinued for the biopsy.  We discussed the slightly higher risk of bruising from the biopsy.  RECOMMENDATION: Ultrasound guided core biopsy is suggested and schedule for the patient on 12/12/2012 9 o'clock a.m.  I have discussed the findings and recommendations with the patient. Results were also provided in writing at the conclusion of the visit.  If applicable, a reminder letter will be sent to the patient regarding the next appointment.  BI-RADS CATEGORY 4:  Suspicious abnormality - biopsy should be considered.   Original Report Authenticated By: Norva Pavlov, M.D.   Mr Breast Bilateral W Wo Contrast  12/18/2012   *RADIOLOGY REPORT*  Clinical Data: Recent diagnosis of invasive ductal carcinoma and ductal carcinoma in situ following ultrasound-guided core biopsy of mass in the 1 o'clock location of the left breast.  BILATERAL BREAST MRI WITH AND WITHOUT CONTRAST  Technique: Multiplanar, multisequence MR images of both breasts were obtained prior to and following the intravenous administration of 16ml of Multihance.  Three dimensional images were evaluated at the independent DynaCad workstation.  Comparison:  Mammogram and ultrasound [facility] 12/12/2012 and earlier  Findings: Background parenchymal enhancement is minimal.  Within the  upper-outer quadrant of the left breast, there is an enhancing mass with irregular shape and irregular margins.  Mass measures 1.3 x 1.4 x 2.0 cm.  Enhancement shows fast initial phase and plateau delayed phase.  Along the medial aspect of the lesion, there is a tissue marker clip following recent ultrasound guided core biopsy showing malignancy.  No suspicious internal mammary or axillary lymph nodes are identified.  Images of the right breast are negative.  IMPRESSION:  1.  Solitary mass within the upper outer quadrant of the left breast, consistent with known malignancy. 2.  No additional findings warranting preoperative evaluation.  RECOMMENDATION: Treatment plan  THREE-DIMENSIONAL MR IMAGE RENDERING ON INDEPENDENT WORKSTATION:  Three-dimensional MR images were rendered by post-processing of the original MR data on an independent workstation.  The three- dimensional MR images were interpreted, and findings were reported in the accompanying complete MRI report for this  study.  BI-RADS CATEGORY 6:  Known biopsy-proven malignancy - appropriate action should be taken.   Original Report Authenticated By: Norva Pavlov, M.D.   Mm Digital Diag Ltd L  12/07/2012   *RADIOLOGY REPORT*  Clinical Data:  The patient returns following tomographic screening for evaluation of the left breast.  DIGITAL DIAGNOSTIC LEFT MAMMOGRAM  AND LEFT BREAST ULTRASOUND:  Comparison:  11/28/2012 and earlier  Findings:  ACR Breast Density Category 3: The breast tissue is heterogeneously dense.  Spot compression views are performed, confirming presence of distortion associated with density in the upper outer quadrant of the left breast.  On physical exam, I palpate no abnormality in the upper-outer quadrant of the left breast.  Ultrasound is performed, showing an irregular, hypoechoic lesion with angular margins and acoustic shadowing in the 1 o'clock location of the left breast, 3 cm from the nipple.  Mass measures 1.5 x 0.9 x 1.3 cm.   There is internal blood flow on Doppler evaluation.  Evaluation of the left axilla is negative for adenopathy.  IMPRESSION:  1.  Suspicious mass in the 1 o'clock location of the left breast. 2.  No evidence for adenopathy. 3.  The patient has a history of internal carotid artery dissection and takes 325 mg of aspirin daily.  This will not be discontinued for the biopsy.  We discussed the slightly higher risk of bruising from the biopsy.  RECOMMENDATION: Ultrasound guided core biopsy is suggested and schedule for the patient on 12/12/2012 9 o'clock a.m.  I have discussed the findings and recommendations with the patient. Results were also provided in writing at the conclusion of the visit.  If applicable, a reminder letter will be sent to the patient regarding the next appointment.  BI-RADS CATEGORY 4:  Suspicious abnormality - biopsy should be considered.   Original Report Authenticated By: Norva Pavlov, M.D.   Mm Digital Diagnostic Unilat L  12/12/2012   *RADIOLOGY REPORT*  Clinical Data:  Status post ultrasound guided core biopsy of left breast mass, 1 o'clock location.  DIGITAL DIAGNOSTIC LEFT MAMMOGRAM  Comparison:  Previous exams.  Findings:  Films are performed following ultrasound guided biopsy of mass in the 1 o'clock location of the left breast.  The top hat shaped clip is identified in the upper-outer quadrant, proximally 7 mm medial to the known spiculated mass.  IMPRESSION: Tissue marker clip is just medial to the area of concern in the left breast.   Original Report Authenticated By: Norva Pavlov, M.D.   Mm Digital Screening  11/28/2012   *RADIOLOGY REPORT*  Clinical Data: Screening.  DIGITAL BILATERAL SCREENING MAMMOGRAM WITH CAD DIGITAL BREAST TOMOSYNTHESIS  Digital breast tomosynthesis images are acquired in two projections.  These images are reviewed in combination with the digital mammogram, confirming the findings below.  Comparison:  Previous exams.  FINDINGS:  ACR Breast Density  Category 3: The breast tissue is heterogeneously dense.  In the left breast, a possible mass warrants further evaluation with spot compression views and possibly ultrasound.  In the right breast, no masses or malignant type calcifications are identified.  Images were processed with CAD.  IMPRESSION: Further evaluation is suggested for possible mass in the left breast.  RECOMMENDATION: Diagnostic mammogram and possibly ultrasound of the left breast. (Code:FI-L-69M)  The patient will be contacted regarding the findings, and additional imaging will be scheduled.  BI-RADS CATEGORY 0:  Incomplete.  Need additional imaging evaluation and/or prior mammograms for comparison.   Original Report Authenticated By: Christiana Pellant, M.D.  [place] Lt Breast Bx W Loc Dev 1st Lesion Img Bx Spec US Guide  12/13/2012   **ADDENDUM** CREATED: 12/13/2012 11:56:37  Pathology results of a left breast biopsy demonstrate grade 2 invasive ductal carcinoma with DCIS.  Pathology results are concordant with imaging findings.  I called the patient today to relay pathology results.  Her questions were answered.  She reports no problems at the biopsy site.  [place]  Her husband has been treated at [facility] and she would like to have her treatment there as well.  She has been scheduled to be seen in the multidisciplinary breast cancer clinic on 12/19/2012. Bilateral breast MRI has been scheduled for 12/18/2012 8 o'clock a.m.  Both of these appointments have been  discussed with the patient and  marked times for her.  **END ADDENDUM** SIGNED BY: Britta Mccreedy, M.D.  12/12/2012   *RADIOLOGY REPORT*  Clinical Data:  The patient returns for ultrasound-guided core biopsy of mass in the 1 o'clock location of the left breast.  ULTRASOUND GUIDED VACUUM ASSISTED CORE BIOPSY OF THE LEFT BREAST  Comparison: Previous exams.  I met with the patient and we discussed the procedure of ultrasound- guided biopsy,  including benefits and alternatives.  We discussed the high likelihood of a successful procedure. We discussed the risks of the procedure including infection, bleeding, tissue injury, clip migration, and inadequate sampling.  Informed written consent was given. The usual time-out protocol was performed immediately prior to the procedure.  Using sterile technique and 2% Lidocaine as local anesthetic, under direct ultrasound visualization, a 12 gauge vacuum-assisteddevice was used to perform biopsy of mass in the 1 o'clock location of the left breast using a lateral approach. At the conclusion of the procedure, a top hat shaped tissue marker clip was deployed into the biopsy cavity.  Follow-up 2-view mammogram was performed and dictated separately.  IMPRESSION: Ultrasound-guided biopsy of left breast mass.  No apparent complications.   Original Report Authenticated By: Norva Pavlov, M.D.      IMPRESSION: T1 C. N0 invasive ductal carcinoma of the left breast  PLAN: I discussed with Ms. E. and her family the equivalency in terms of survival between mastectomy and lumpectomy. We discussed the role of radiation in decreasing local failures in patients who undergo breast conservation. We discussed the process of simulation the placement tattoos. We discussed 4-6 weeks of treatment as an outpatient. We discussed the possible side effects of treatment including but not limited to heart lung and rib damage. We discussed use of breath hold technique for cardiac sparing. We discussed skin redness and fatigue as possible side effects as well. We discussed the radiation usually begin after chemotherapy. I will plan on meeting with her after medical oncology has gotten the results of her Oncotype back to determine whether not she would need chemotherapy. She met with the surgeon as well as medical oncology and a member of our patient family support team. As a side note she does live near [place]. She and her husband  moved down there however her husband has been treated at the cancer Center here. We discussed briefly treatment at [facility] or [facility]. At this point she is unsure where she would like to receive her radiation treatments. I spent 40 minutes  face to face with the patient and more than 50% of that time was spent in counseling and/or coordination of care.   ------------------------------------------------  Lurline Hare, MD

## 2012-12-19 NOTE — Progress Notes (Signed)
PCP: [name], MD GYN: SU:  OTHER MD:

## 2012-12-19 NOTE — Addendum Note (Signed)
Encounter addended by: Lurline Hare, MD on: 12/19/2012 12:50 PM<BR>     Documentation filed: Notes Section

## 2012-12-19 NOTE — Progress Notes (Signed)
ID: [name] OB: [birth year]  MR#: [id number]  CSN#:[id number]  PCP: No primary provider on file. GYN:  Annamaria Helling SU: Ovidio Kin OTHER MD: Lurline Hare, Karene Fry, Porfirio Mylar Dohmeier   HISTORY OF PRESENT ILLNESS: [name] had routine mammography/tomography 11/28/2012 suggesting and abnormality in the left breast. Diagnostic left mammography and ultrasonography at the breast Center 12/07/2012 confirmed a density in the upper outer quadrant of the left breast, which was not palpable by exam. Ultrasound showed an irregular hypoechoic lesion in the left breast measuring 1.5 cm. The left axilla was sonographically benign.  Biopsy of the left breast mass in question 12/12/2012 showed an invasive ductal carcinoma, grade 2, estrogen and progesterone receptor positive, HER-2 not amplified, with an MIB-1 of approximately 10%. Breast MRI 12/18/2012 measured the left breast mass at 2.0 cm. There were no other masses of concern, and no suspicious internal mammary or axillary lymph nodes identified.  The patient's subsequent history is as detailed below.  INTERVAL HISTORY: [name] was seen in the multidisciplinary breast cancer clinic 12/19/2012 accompanied by her husband Brianna November and her daughter Hospital doctor  REVIEW OF SYSTEMS: There were no unusual symptoms leading to the screening mammogram, which was routinely scheduled. The patient has not palpated any masses in the breast prior to the mammogram, or actually even after. She complains of ringing in her years, and sinus problems, which occasionally cause her to be dizzy. She denies frank positional vertigo. She has some hemorrhoidal bleeding. She is low back pain frequently, but it is not very intense or persistent. She has palpitations with stress but this is not accompanied by chest pain or pressure or by shortness of breath. She has a history of migraines, which are rare. A detailed review of systems was otherwise noncontributory  PAST MEDICAL HISTORY: No past  medical history on file. Bilateral carotid artery stenosis, status post dilatation PAST SURGICAL HISTORY: Past Surgical History  Procedure Laterality Date  . Back surgery     removal of a nasal polyp  FAMILY HISTORY Family History  Problem Relation Age of Onset  . Uterine cancer Mother   . Brain cancer Paternal Grandfather   . Breast cancer Cousin   . Melanoma Daughter    the patient's father died from a stroke at age 46. The patient's mother is living, currently age 49. She was diagnosed with cancer of the uterus at age 42. The patient had one grandfather diagnosed with cancer of the brain. The patient has one maternal cousin diagnosed with breast cancer in her 41s. The patient herself has 3 brothers and 3 sisters, none with cancer. The patient's daughter was diagnosed with melanoma in situ at the age of 67. She also has a history of DVT. There is no history of ovarian cancer in the family.  GYNECOLOGIC HISTORY:  Menarche age 53, first live birth age 35, the patient is GX P3. Her periods are now very irregular, she just had one 12/16/2012, the one prior to that was 03/18/2012.  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 Her husband Casimiro Needle "Brianna November" Torres, works for [employer] in Designer, fashion/clothing ("I practically live in [place]"). Daughter Hospital doctor lives in [place] where she works as an a Radio producer. Son [name]") lives in [place] where he works as an Theatre stage manager. Son [name] is in high school and lives with the patient at home. There are no grandchildren. The patient attends a local [address].    ADVANCED DIRECTIVES: Not in place  HEALTH MAINTENANCE: History  Substance Use Topics  . Smoking status: Never Smoker   . Smokeless tobacco: Not on file  . Alcohol Use: No     Colonoscopy: 2007/ [facility]  PAP: June 2014  Bone density: Never  Lipid panel:  No Known Allergies  Current Outpatient Prescriptions  Medication Sig Dispense Refill  .  aspirin 81 MG tablet Take 81 mg by mouth daily.      Marland Kitchen atorvastatin (LIPITOR) 10 MG tablet Take 10 mg by mouth daily.      . Calcium Carbonate-Vitamin D (CALCIUM + D PO) Take by mouth daily.      . citalopram (CELEXA) 10 MG tablet Take 10 mg by mouth daily.      . Fluticasone Propionate (FLONASE NA) Place into the nose.      . Multiple Vitamins-Minerals (CENTRUM SILVER ADULT 50+ PO) Take by mouth.       No current facility-administered medications for this visit.    OBJECTIVE: Middle-aged white woman in no acute distress Filed Vitals:   12/19/12 0848  BP: 149/90  Pulse: 82  Temp: 98.2 F (36.8 C)  Resp: 20     Body mass index is 29.7 kg/(m^2).    ECOG FS: 0  Sclerae unicteric Oropharynx clear No cervical or supraclavicular adenopathy Lungs no rales or rhonchi Heart regular rate and rhythm Abd obese, benign MSK no focal spinal tenderness, no peripheral edema Neuro: non-focal, well-oriented, anxious affect Breasts: The right breast is unremarkable. The left breast is status post recent biopsy. There is a moderate ecchymosis. There are no skin or nipple changes of concern. The left axilla is benign.   LAB RESULTS:  CMP     Component Value Date/Time   NA 141 12/19/2012 0824   K 3.9 12/19/2012 0824   CL 107 12/19/2012 0824   CO2 24 12/19/2012 0824   GLUCOSE 78 12/19/2012 0824   BUN 15.2 12/19/2012 0824   CREATININE 0.8 12/19/2012 0824   CALCIUM 9.5 12/19/2012 0824   PROT 7.2 12/19/2012 0824   ALBUMIN 3.8 12/19/2012 0824   AST 18 12/19/2012 0824   ALT 25 12/19/2012 0824   ALKPHOS 71 12/19/2012 0824   BILITOT 0.61 12/19/2012 0824    I No results found for this basename: SPEP, UPEP,  kappa and lambda light chains    Lab Results  Component Value Date   WBC 5.9 12/19/2012   NEUTROABS 3.6 12/19/2012   HGB 14.6 12/19/2012   HCT 43.0 12/19/2012   MCV 88.6 12/19/2012   PLT 208 12/19/2012      Chemistry      Component Value Date/Time   NA 141 12/19/2012 0824   K 3.9 12/19/2012 0824    CL 107 12/19/2012 0824   CO2 24 12/19/2012 0824   BUN 15.2 12/19/2012 0824   CREATININE 0.8 12/19/2012 0824      Component Value Date/Time   CALCIUM 9.5 12/19/2012 0824   ALKPHOS 71 12/19/2012 0824   AST 18 12/19/2012 0824   ALT 25 12/19/2012 0824   BILITOT 0.61 12/19/2012 0824       No results found for this basename: LABCA2    No components found with this basename: LABCA125    No results found for this basename: INR,  in the last 168 hours  Urinalysis No results found for this basename: colorurine, appearanceur, labspec, phurine, glucoseu, hgbur, bilirubinur, ketonesur, proteinur, urobilinogen, nitrite, leukocytesur    STUDIES: US Breast Left  12/07/2012   *RADIOLOGY REPORT*  Clinical Data:  The patient returns following tomographic screening for evaluation of the left breast.  DIGITAL DIAGNOSTIC LEFT MAMMOGRAM  AND LEFT BREAST ULTRASOUND:  Comparison:  11/28/2012 and earlier  Findings:  ACR Breast Density Category 3: The breast tissue is heterogeneously dense.  Spot compression views are performed, confirming presence of distortion associated with density in the upper outer quadrant of the left breast.  On physical exam, I palpate no abnormality in the upper-outer quadrant of the left breast.  Ultrasound is performed, showing an irregular, hypoechoic lesion with angular margins and acoustic shadowing in the 1 o'clock location of the left breast, 3 cm from the nipple.  Mass measures 1.5 x 0.9 x 1.3 cm.  There is internal blood flow on Doppler evaluation.  Evaluation of the left axilla is negative for adenopathy.  IMPRESSION:  1.  Suspicious mass in the 1 o'clock location of the left breast. 2.  No evidence for adenopathy. 3.  The patient has a history of internal carotid artery dissection and takes 325 mg of aspirin daily.  This will not be discontinued for the biopsy.  We discussed the slightly higher risk of bruising from the biopsy.  RECOMMENDATION: Ultrasound guided core biopsy is  suggested and schedule for the patient on 12/12/2012 9 o'clock a.m.  I have discussed the findings and recommendations with the patient. Results were also provided in writing at the conclusion of the visit.  If applicable, a reminder letter will be sent to the patient regarding the next appointment.  BI-RADS CATEGORY 4:  Suspicious abnormality - biopsy should be considered.   Original Report Authenticated By: Norva Pavlov, M.D.   Mr Breast Bilateral W Wo Contrast  12/18/2012   *RADIOLOGY REPORT*  Clinical Data: Recent diagnosis of invasive ductal carcinoma and ductal carcinoma in situ following ultrasound-guided core biopsy of mass in the 1 o'clock location of the left breast.  BILATERAL BREAST MRI WITH AND WITHOUT CONTRAST  Technique: Multiplanar, multisequence MR images of both breasts were obtained prior to and following the intravenous administration of 16ml of Multihance.  Three dimensional images were evaluated at the independent DynaCad workstation.  Comparison:  Mammogram and ultrasound [facility] 12/12/2012 and earlier  Findings: Background parenchymal enhancement is minimal.  Within the upper-outer quadrant of the left breast, there is an enhancing mass with irregular shape and irregular margins.  Mass measures 1.3 x 1.4 x 2.0 cm.  Enhancement shows fast initial phase and plateau delayed phase.  Along the medial aspect of the lesion, there is a tissue marker clip following recent ultrasound guided core biopsy showing malignancy.  No suspicious internal mammary or axillary lymph nodes are identified.  Images of the right breast are negative.  IMPRESSION:  1.  Solitary mass within the upper outer quadrant of the left breast, consistent with known malignancy. 2.  No additional findings warranting preoperative evaluation.  RECOMMENDATION: Treatment plan  THREE-DIMENSIONAL MR IMAGE RENDERING ON INDEPENDENT WORKSTATION:  Three-dimensional MR images were rendered by  post-processing of the original MR data on an independent workstation.  The three- dimensional MR images were interpreted, and findings were reported in the accompanying complete MRI report for this study.  BI-RADS CATEGORY 6:  Known biopsy-proven malignancy - appropriate action should be taken.   Original Report Authenticated By: Norva Pavlov, M.D.   Mm Digital Diag Ltd L  12/07/2012   *RADIOLOGY REPORT*  Clinical Data:  The patient returns following tomographic screening for evaluation of the left breast.  DIGITAL DIAGNOSTIC LEFT  MAMMOGRAM  AND LEFT BREAST ULTRASOUND:  Comparison:  11/28/2012 and earlier  Findings:  ACR Breast Density Category 3: The breast tissue is heterogeneously dense.  Spot compression views are performed, confirming presence of distortion associated with density in the upper outer quadrant of the left breast.  On physical exam, I palpate no abnormality in the upper-outer quadrant of the left breast.  Ultrasound is performed, showing an irregular, hypoechoic lesion with angular margins and acoustic shadowing in the 1 o'clock location of the left breast, 3 cm from the nipple.  Mass measures 1.5 x 0.9 x 1.3 cm.  There is internal blood flow on Doppler evaluation.  Evaluation of the left axilla is negative for adenopathy.  IMPRESSION:  1.  Suspicious mass in the 1 o'clock location of the left breast. 2.  No evidence for adenopathy. 3.  The patient has a history of internal carotid artery dissection and takes 325 mg of aspirin daily.  This will not be discontinued for the biopsy.  We discussed the slightly higher risk of bruising from the biopsy.  RECOMMENDATION: Ultrasound guided core biopsy is suggested and schedule for the patient on 12/12/2012 9 o'clock a.m.  I have discussed the findings and recommendations with the patient. Results were also provided in writing at the conclusion of the visit.  If applicable, a reminder letter will be sent to the patient regarding the next appointment.   BI-RADS CATEGORY 4:  Suspicious abnormality - biopsy should be considered.   Original Report Authenticated By: Norva Pavlov, M.D.   Mm Digital Diagnostic Unilat L  12/12/2012   *RADIOLOGY REPORT*  Clinical Data:  Status post ultrasound guided core biopsy of left breast mass, 1 o'clock location.  DIGITAL DIAGNOSTIC LEFT MAMMOGRAM  Comparison:  Previous exams.  Findings:  Films are performed following ultrasound guided biopsy of mass in the 1 o'clock location of the left breast.  The top hat shaped clip is identified in the upper-outer quadrant, proximally 7 mm medial to the known spiculated mass.  IMPRESSION: Tissue marker clip is just medial to the area of concern in the left breast.   Original Report Authenticated By: Norva Pavlov, M.D.   Mm Digital Screening  11/28/2012   *RADIOLOGY REPORT*  Clinical Data: Screening.  DIGITAL BILATERAL SCREENING MAMMOGRAM WITH CAD DIGITAL BREAST TOMOSYNTHESIS  Digital breast tomosynthesis images are acquired in two projections.  These images are reviewed in combination with the digital mammogram, confirming the findings below.  Comparison:  Previous exams.  FINDINGS:  ACR Breast Density Category 3: The breast tissue is heterogeneously dense.  In the left breast, a possible mass warrants further evaluation with spot compression views and possibly ultrasound.  In the right breast, no masses or malignant type calcifications are identified.  Images were processed with CAD.  IMPRESSION: Further evaluation is suggested for possible mass in the left breast.  RECOMMENDATION: Diagnostic mammogram and possibly ultrasound of the left breast. (Code:FI-L-86M)  The patient will be contacted regarding the findings, and additional imaging will be scheduled.  BI-RADS CATEGORY 0:  Incomplete.  Need additional imaging evaluation and/or prior mammograms for comparison.   Original Report Authenticated By: Christiana Pellant, M.D.   [place] Lt Breast Bx W Loc Dev 1st Lesion Img Bx Spec US  Guide  12/13/2012   **ADDENDUM** CREATED: 12/13/2012 11:56:37  Pathology results of a left breast biopsy demonstrate grade 2 invasive ductal carcinoma with DCIS.  Pathology results are concordant with imaging findings.  I called the patient today to relay pathology results.  Her questions were answered.  She reports no problems at the biopsy site.  The patient [place].  Her husband has been [facility] at [facility] and she would like to have her treatment there as well.  She has been scheduled to be seen in the multidisciplinary breast cancer clinic on 12/19/2012. Bilateral breast MRI has been scheduled for 12/18/2012 8 o'clock a.m.  Both of these appointments have been  discussed with the patient and  marked times for her.  **END ADDENDUM** SIGNED BY: Britta Mccreedy, M.D.  12/12/2012   *RADIOLOGY REPORT*  Clinical Data:  The patient returns for ultrasound-guided core biopsy of mass in the 1 o'clock location of the left breast.  ULTRASOUND GUIDED VACUUM ASSISTED CORE BIOPSY OF THE LEFT BREAST  Comparison: Previous exams.  I met with the patient and we discussed the procedure of ultrasound- guided biopsy, including benefits and alternatives.  We discussed the high likelihood of a successful procedure. We discussed the risks of the procedure including infection, bleeding, tissue injury, clip migration, and inadequate sampling.  Informed written consent was given. The usual time-out protocol was performed immediately prior to the procedure.  Using sterile technique and 2% Lidocaine as local anesthetic, under direct ultrasound visualization, a 12 gauge vacuum-assisteddevice was used to perform biopsy of mass in the 1 o'clock location of the left breast using a lateral approach. At the conclusion of the procedure, a top hat shaped tissue marker clip was deployed into the biopsy cavity.  Follow-up 2-view mammogram was performed and dictated separately.  IMPRESSION: Ultrasound-guided  biopsy of left breast mass.  No apparent complications.   Original Report Authenticated By: Norva Pavlov, M.D.    ASSESSMENT: 57 y.o. [place], [place] woman status post left breast biopsy 12/12/2012 for a clinical T1c N0, stage IA invasive ductal carcinoma, grade 2, estrogen and progesterone receptor positive, with no HER-2 amplification, and an MIB-1 of approximately 10%.  PLAN: We spent the better part of today's hour-long visit discussing the biology of breast cancer and the specifics of the patient's own situation. Morning understands a lumpectomy followed by radiation is just as good as a full mastectomy in terms of survival, and accordingly that is your choice. She also understands that she will need systemic treatment and that antiestrogen pills will be beneficial.  A more complicated question relates to chemotherapy. In general chemotherapy lowers the risk of recurrence by about one third, but if the total risk is low enough then perhaps chemotherapy can be avoided. To help [place] with this decision we're going to be sending an Oncotype DX from her definitive surgery. The patient understands that if she turns out to be lymph node positive we will cancel the Oncotype and that she would then need chemotherapy. If she is lymph node negative, the decision will depend on her recurrence score and if that falls in the intermediate or high range then she will need chemotherapy.  Erskine Squibb is a very good understanding of all of these issues. I am making her a return appointment with me approximately 3 weeks after her surgery and also an appointment with genetics, given her young age. She knows to call for any problems that may develop before the next visit here.  Lowella Dell, MD   12/19/2012 11:34 AM

## 2012-12-20 ENCOUNTER — Telehealth: Payer: Self-pay | Admitting: Neurology

## 2012-12-20 ENCOUNTER — Encounter: Payer: Self-pay | Admitting: Specialist

## 2012-12-20 NOTE — Progress Notes (Signed)
I met [name] at breast clinic on June 18.  She was accompanied by her husband and her daughter.  She rated her distress as a "5".  I encouraged her to attend Breast Cancer Support Group and made a referral for her to [facility].

## 2012-12-20 NOTE — Telephone Encounter (Signed)
Yes, it would be permissible to stop ASA for 5 days prior . Her surgeon needs to know she had a carotid  Disease history / horner syndrome .  She should start with ASA right again after surgery.

## 2012-12-20 NOTE — Telephone Encounter (Signed)
The patient is scheduled for surgery on 12-31-12 and she wants to know if it's OK with Dr. Vickey Huger to stop her aspirin therapy 7 days prior to surgery.

## 2012-12-21 ENCOUNTER — Telehealth (INDEPENDENT_AMBULATORY_CARE_PROVIDER_SITE_OTHER): Payer: Self-pay

## 2012-12-21 ENCOUNTER — Telehealth: Payer: Self-pay

## 2012-12-21 ENCOUNTER — Encounter: Payer: Self-pay | Admitting: *Deleted

## 2012-12-21 NOTE — Telephone Encounter (Signed)
I returned a call from [facility] at Dr. Allene Pyo office. She wanted [place] to fax a note to confirm that patient may stop ASA 5 days before surgery. Confirming that office note is adequate and the fax # [phone]. Cyndra Numbers stated that this is correct. Office note faxed.

## 2012-12-21 NOTE — Telephone Encounter (Signed)
I called pt and and LMVM with Dr. Oliva Bustard response below.  I will make a letter if she calls back and requests it.

## 2012-12-21 NOTE — Telephone Encounter (Signed)
Pt called stating her neurologist, Porfirio Mylar Dohmeimer ok'd stopping ASA 5 days prior to her breast surgery.  Will contact Dr. Oliva Bustard office for documentation faxed.

## 2012-12-24 ENCOUNTER — Telehealth: Payer: Self-pay

## 2012-12-24 ENCOUNTER — Telehealth: Payer: Self-pay | Admitting: *Deleted

## 2012-12-24 NOTE — Telephone Encounter (Signed)
Patient calling with Fax number for surgeon. This was requested so Dr. Vickey Huger can send over some information. Fax # [phone] Dr. Ezzard Standing [facility]

## 2012-12-24 NOTE — Telephone Encounter (Signed)
Called and spoke with patient from [facility] 12/19/12.  No questions or concerns at this time.  Contact information given.  

## 2012-12-25 ENCOUNTER — Encounter (HOSPITAL_BASED_OUTPATIENT_CLINIC_OR_DEPARTMENT_OTHER): Payer: Self-pay | Admitting: *Deleted

## 2012-12-25 ENCOUNTER — Telehealth: Payer: Self-pay | Admitting: *Deleted

## 2012-12-25 NOTE — Progress Notes (Signed)
hcx carotid stenosis-monitored and stable-no cardiac or resp problems- Labs done 11/18/12

## 2012-12-25 NOTE — Telephone Encounter (Signed)
Called patient home phone [phone], no answer,left voice message that per Dr.Wentworth, she may be a candidate for radiation  And will call patient back  Today or tomorrow after she talks with Dr. Ezzard Standing, can call if any questions not answered by Rn 9:38 AM

## 2012-12-26 ENCOUNTER — Encounter: Payer: Self-pay | Admitting: Radiation Oncology

## 2012-12-26 NOTE — Progress Notes (Signed)
I spoke to the patient today on the phone. She had called in asking about her appropriateness for partial breast radiation. We discussed the process of placement of a cavity evaluation device, assessment of this with a CT scan and placement of the treatment catheter. We discussed that it usually about a week of planning followed by a week of treatment. We discussed the pros and cons of partial breast radiation versus whole breast radiation including a possible risk of elsewhere failures within the breast as well as the increased likelihood of a palpable hard seroma cavity after treatment using partial breast radiation. We discussed that her surgery may have to be delayed by a week in order to find time in the operating room. We discussed that the catheter could be pulled and she might not be a candidate if the balloon to surface distance or the balloon to chest wall distance is not appropriate. I gave her the [facility] web address and she is going to look at that today. I told her Dr. Ezzard Standing would be in contact with her tomorrow.

## 2012-12-28 ENCOUNTER — Telehealth (INDEPENDENT_AMBULATORY_CARE_PROVIDER_SITE_OTHER): Payer: Self-pay | Admitting: *Deleted

## 2012-12-28 NOTE — Telephone Encounter (Signed)
Patient called to state that she missed a call from Dr. Ezzard Standing last night and was trying to get back in touch with him.

## 2012-12-31 ENCOUNTER — Ambulatory Visit
Admission: RE | Admit: 2012-12-31 | Discharge: 2012-12-31 | Disposition: A | Discharge status: Home / Self Care | Payer: Managed Care, Other (non HMO) | Source: Ambulatory Visit | Attending: Surgery | Admitting: Surgery

## 2012-12-31 ENCOUNTER — Ambulatory Visit (HOSPITAL_BASED_OUTPATIENT_CLINIC_OR_DEPARTMENT_OTHER)
Admission: RE | Admit: 2012-12-31 | Discharge: 2012-12-31 | Disposition: A | Discharge status: Home / Self Care | Payer: Managed Care, Other (non HMO) | Source: Ambulatory Visit | Attending: Surgery | Admitting: Surgery

## 2012-12-31 ENCOUNTER — Encounter (HOSPITAL_BASED_OUTPATIENT_CLINIC_OR_DEPARTMENT_OTHER): Payer: Self-pay | Admitting: Anesthesiology

## 2012-12-31 ENCOUNTER — Encounter (HOSPITAL_BASED_OUTPATIENT_CLINIC_OR_DEPARTMENT_OTHER): Payer: Self-pay

## 2012-12-31 ENCOUNTER — Inpatient Hospital Stay: Admission: RE | Admit: 2012-12-31 | Payer: Commercial Indemnity | Source: Ambulatory Visit

## 2012-12-31 ENCOUNTER — Ambulatory Visit (HOSPITAL_COMMUNITY)
Admission: RE | Admit: 2012-12-31 | Discharge: 2012-12-31 | Disposition: A | Discharge status: Home / Self Care | Payer: Managed Care, Other (non HMO) | Source: Ambulatory Visit | Attending: Surgery | Admitting: Surgery

## 2012-12-31 ENCOUNTER — Other Ambulatory Visit (INDEPENDENT_AMBULATORY_CARE_PROVIDER_SITE_OTHER): Payer: Self-pay | Admitting: Surgery

## 2012-12-31 ENCOUNTER — Ambulatory Visit (HOSPITAL_BASED_OUTPATIENT_CLINIC_OR_DEPARTMENT_OTHER): Payer: Managed Care, Other (non HMO) | Admitting: Anesthesiology

## 2012-12-31 ENCOUNTER — Encounter (HOSPITAL_BASED_OUTPATIENT_CLINIC_OR_DEPARTMENT_OTHER)
Admission: RE | Disposition: A | Discharge status: Home / Self Care | Payer: Self-pay | Source: Ambulatory Visit | Attending: Surgery

## 2012-12-31 DIAGNOSIS — C50912 Malignant neoplasm of unspecified site of left female breast: Secondary | ICD-10-CM

## 2012-12-31 DIAGNOSIS — D0590 Unspecified type of carcinoma in situ of unspecified breast: Secondary | ICD-10-CM

## 2012-12-31 DIAGNOSIS — I6529 Occlusion and stenosis of unspecified carotid artery: Secondary | ICD-10-CM | POA: Insufficient documentation

## 2012-12-31 DIAGNOSIS — C50412 Malignant neoplasm of upper-outer quadrant of left female breast: Secondary | ICD-10-CM

## 2012-12-31 DIAGNOSIS — E7800 Pure hypercholesterolemia, unspecified: Secondary | ICD-10-CM | POA: Insufficient documentation

## 2012-12-31 DIAGNOSIS — Z7982 Long term (current) use of aspirin: Secondary | ICD-10-CM | POA: Insufficient documentation

## 2012-12-31 DIAGNOSIS — Z803 Family history of malignant neoplasm of breast: Secondary | ICD-10-CM | POA: Insufficient documentation

## 2012-12-31 DIAGNOSIS — C50419 Malignant neoplasm of upper-outer quadrant of unspecified female breast: Secondary | ICD-10-CM | POA: Insufficient documentation

## 2012-12-31 DIAGNOSIS — K649 Unspecified hemorrhoids: Secondary | ICD-10-CM | POA: Insufficient documentation

## 2012-12-31 DIAGNOSIS — Z170 Estrogen receptor positive status [ER+]: Secondary | ICD-10-CM | POA: Insufficient documentation

## 2012-12-31 DIAGNOSIS — C773 Secondary and unspecified malignant neoplasm of axilla and upper limb lymph nodes: Secondary | ICD-10-CM | POA: Insufficient documentation

## 2012-12-31 DIAGNOSIS — Z79899 Other long term (current) drug therapy: Secondary | ICD-10-CM | POA: Insufficient documentation

## 2012-12-31 HISTORY — DX: Nausea with vomiting, unspecified: R11.2

## 2012-12-31 HISTORY — PX: BREAST LUMPECTOMY WITH NEEDLE LOCALIZATION AND AXILLARY SENTINEL LYMPH NODE BX: SHX5760

## 2012-12-31 HISTORY — DX: Occlusion and stenosis of unspecified carotid artery: I65.29

## 2012-12-31 HISTORY — PX: BREAST LUMPECTOMY: SHX2

## 2012-12-31 HISTORY — DX: Other seasonal allergic rhinitis: J30.2

## 2012-12-31 HISTORY — DX: Presence of spectacles and contact lenses: Z97.3

## 2012-12-31 HISTORY — DX: Other specified postprocedural states: Z98.890

## 2012-12-31 SURGERY — BREAST LUMPECTOMY WITH NEEDLE LOCALIZATION AND AXILLARY SENTINEL LYMPH NODE BX
Anesthesia: General | Site: Breast | Laterality: Left | Wound class: Clean

## 2012-12-31 MED ORDER — LACTATED RINGERS IV SOLN
INTRAVENOUS | Status: DC
  Administered 2012-12-31: 13:00:00 via INTRAVENOUS
  Administered 2012-12-31: 20 mL/h via INTRAVENOUS

## 2012-12-31 MED ORDER — TECHNETIUM TC 99M SULFUR COLLOID FILTERED
1.0000 | Freq: Once | INTRAVENOUS | Status: AC | PRN
  Administered 2012-12-31: 1 via INTRADERMAL

## 2012-12-31 MED ORDER — FENTANYL CITRATE 0.05 MG/ML IJ SOLN
INTRAMUSCULAR | Status: DC | PRN
  Administered 2012-12-31: 50 ug via INTRAVENOUS

## 2012-12-31 MED ORDER — SCOPOLAMINE 1 MG/3DAYS TD PT72
1.0000 | MEDICATED_PATCH | TRANSDERMAL | Status: DC
  Administered 2012-12-31: 1.5 mg via TRANSDERMAL

## 2012-12-31 MED ORDER — SODIUM CHLORIDE 0.9 % IJ SOLN
INTRAMUSCULAR | Status: DC | PRN
  Administered 2012-12-31: 15:00:00 via INTRAMUSCULAR

## 2012-12-31 MED ORDER — EPHEDRINE SULFATE 50 MG/ML IJ SOLN
INTRAMUSCULAR | Status: DC | PRN
  Administered 2012-12-31 (×2): 10 mg via INTRAVENOUS

## 2012-12-31 MED ORDER — ONDANSETRON HCL 4 MG/2ML IJ SOLN: 4.0000 mg | Freq: Once | INTRAMUSCULAR | Status: DC | PRN

## 2012-12-31 MED ORDER — LIDOCAINE HCL (CARDIAC) 20 MG/ML IV SOLN
INTRAVENOUS | Status: DC | PRN
  Administered 2012-12-31: 80 mg via INTRAVENOUS

## 2012-12-31 MED ORDER — CEFAZOLIN SODIUM-DEXTROSE 2-3 GM-% IV SOLR
2.0000 g | INTRAVENOUS | Status: AC
  Administered 2012-12-31: 2 g via INTRAVENOUS

## 2012-12-31 MED ORDER — CHLORHEXIDINE GLUCONATE 4 % EX LIQD: 1.0000 "application " | Freq: Once | CUTANEOUS | Status: DC

## 2012-12-31 MED ORDER — OXYCODONE HCL 5 MG/5ML PO SOLN: 5.0000 mg | Freq: Once | ORAL | Status: AC | PRN

## 2012-12-31 MED ORDER — HYDROCODONE-ACETAMINOPHEN 5-325 MG PO TABS: 1.0000 | ORAL_TABLET | Freq: Four times a day (QID) | ORAL | Status: DC | PRN

## 2012-12-31 MED ORDER — DEXAMETHASONE SODIUM PHOSPHATE 4 MG/ML IJ SOLN
INTRAMUSCULAR | Status: DC | PRN
  Administered 2012-12-31: 10 mg via INTRAVENOUS

## 2012-12-31 MED ORDER — MIDAZOLAM HCL 5 MG/5ML IJ SOLN
INTRAMUSCULAR | Status: DC | PRN
  Administered 2012-12-31: 1 mg via INTRAVENOUS

## 2012-12-31 MED ORDER — 0.9 % SODIUM CHLORIDE (POUR BTL) OPTIME
TOPICAL | Status: DC | PRN
  Administered 2012-12-31: 1000 mL

## 2012-12-31 MED ORDER — ONDANSETRON HCL 4 MG/2ML IJ SOLN
INTRAMUSCULAR | Status: DC | PRN
  Administered 2012-12-31: 4 mg via INTRAVENOUS

## 2012-12-31 MED ORDER — BUPIVACAINE HCL (PF) 0.25 % IJ SOLN
INTRAMUSCULAR | Status: DC | PRN
  Administered 2012-12-31: 30 mL

## 2012-12-31 MED ORDER — HYDROMORPHONE HCL PF 1 MG/ML IJ SOLN
0.2500 mg | INTRAMUSCULAR | Status: DC | PRN
  Administered 2012-12-31 (×2): 0.5 mg via INTRAVENOUS

## 2012-12-31 MED ORDER — MIDAZOLAM HCL 2 MG/2ML IJ SOLN
1.0000 mg | INTRAMUSCULAR | Status: DC | PRN
  Administered 2012-12-31: 2 mg via INTRAVENOUS

## 2012-12-31 MED ORDER — PROPOFOL 10 MG/ML IV BOLUS
INTRAVENOUS | Status: DC | PRN
  Administered 2012-12-31: 200 mg via INTRAVENOUS

## 2012-12-31 MED ORDER — FENTANYL CITRATE 0.05 MG/ML IJ SOLN
50.0000 ug | INTRAMUSCULAR | Status: DC | PRN
  Administered 2012-12-31: 100 ug via INTRAVENOUS

## 2012-12-31 MED ORDER — OXYCODONE HCL 5 MG PO TABS
5.0000 mg | ORAL_TABLET | Freq: Once | ORAL | Status: AC | PRN
  Administered 2012-12-31: 5 mg via ORAL

## 2012-12-31 SURGICAL SUPPLY — 62 items
APPLIER CLIP 11 MED OPEN (CLIP)
BANDAGE ELASTIC 6 VELCRO ST LF (GAUZE/BANDAGES/DRESSINGS) IMPLANT
BINDER BREAST LRG (GAUZE/BANDAGES/DRESSINGS) IMPLANT
BINDER BREAST MEDIUM (GAUZE/BANDAGES/DRESSINGS) IMPLANT
BINDER BREAST XLRG (GAUZE/BANDAGES/DRESSINGS) ×2 IMPLANT
BINDER BREAST XXLRG (GAUZE/BANDAGES/DRESSINGS) IMPLANT
BLADE SURG 10 STRL SS (BLADE) ×2 IMPLANT
BLADE SURG 15 STRL LF DISP TIS (BLADE) ×1 IMPLANT
BLADE SURG 15 STRL SS (BLADE) ×1
CANISTER SUCTION 1200CC (MISCELLANEOUS) ×2 IMPLANT
CHLORAPREP W/TINT 26ML (MISCELLANEOUS) ×2 IMPLANT
CLIP APPLIE 11 MED OPEN (CLIP) IMPLANT
CLIP TI WIDE RED SMALL 6 (CLIP) ×4 IMPLANT
CLOTH BEACON ORANGE TIMEOUT ST (SAFETY) ×2 IMPLANT
COVER MAYO STAND STRL (DRAPES) ×2 IMPLANT
COVER PROBE W GEL 5X96 (DRAPES) ×2 IMPLANT
COVER TABLE BACK 60X90 (DRAPES) ×2 IMPLANT
DECANTER SPIKE VIAL GLASS SM (MISCELLANEOUS) ×2 IMPLANT
DERMABOND ADVANCED (GAUZE/BANDAGES/DRESSINGS) ×1
DERMABOND ADVANCED .7 DNX12 (GAUZE/BANDAGES/DRESSINGS) ×1 IMPLANT
DEVICE DUBIN W/COMP PLATE 8390 (MISCELLANEOUS) ×2 IMPLANT
DRAIN CHANNEL 19F RND (DRAIN) IMPLANT
DRAIN HEMOVAC 1/8 X 5 (WOUND CARE) IMPLANT
DRAPE LAPAROSCOPIC ABDOMINAL (DRAPES) ×2 IMPLANT
DRAPE UTILITY XL STRL (DRAPES) ×2 IMPLANT
ELECT COATED BLADE 2.86 ST (ELECTRODE) ×2 IMPLANT
ELECT REM PT RETURN 9FT ADLT (ELECTROSURGICAL) ×2
ELECTRODE REM PT RTRN 9FT ADLT (ELECTROSURGICAL) ×1 IMPLANT
EVACUATOR SILICONE 100CC (DRAIN) IMPLANT
GAUZE SPONGE 4X4 12PLY STRL LF (GAUZE/BANDAGES/DRESSINGS) IMPLANT
GAUZE SPONGE 4X4 16PLY XRAY LF (GAUZE/BANDAGES/DRESSINGS) IMPLANT
GLOVE BIO SURGEON STRL SZ 6.5 (GLOVE) ×2 IMPLANT
GLOVE BIOGEL M STRL SZ7.5 (GLOVE) ×2 IMPLANT
GLOVE BIOGEL PI IND STRL 8 (GLOVE) ×1 IMPLANT
GLOVE BIOGEL PI INDICATOR 8 (GLOVE) ×1
GLOVE EXAM NITRILE MD LF STRL (GLOVE) ×2 IMPLANT
GLOVE SURG SIGNA 7.5 PF LTX (GLOVE) ×4 IMPLANT
GOWN PREVENTION PLUS XLARGE (GOWN DISPOSABLE) ×2 IMPLANT
GOWN PREVENTION PLUS XXLARGE (GOWN DISPOSABLE) ×4 IMPLANT
KIT MARKER MARGIN INK (KITS) ×2 IMPLANT
NDL SAFETY ECLIPSE 18X1.5 (NEEDLE) ×1 IMPLANT
NEEDLE HYPO 18GX1.5 SHARP (NEEDLE) ×1
NEEDLE HYPO 25X1 1.5 SAFETY (NEEDLE) ×4 IMPLANT
NS IRRIG 1000ML POUR BTL (IV SOLUTION) ×2 IMPLANT
PACK BASIN DAY SURGERY FS (CUSTOM PROCEDURE TRAY) ×2 IMPLANT
PAD ALCOHOL SWAB (MISCELLANEOUS) ×2 IMPLANT
PENCIL BUTTON HOLSTER BLD 10FT (ELECTRODE) ×2 IMPLANT
PIN SAFETY STERILE (MISCELLANEOUS) IMPLANT
SHEET MEDIUM DRAPE 40X70 STRL (DRAPES) ×2 IMPLANT
SLEEVE SCD COMPRESS KNEE MED (MISCELLANEOUS) ×2 IMPLANT
SPONGE GAUZE 4X4 12PLY (GAUZE/BANDAGES/DRESSINGS) IMPLANT
SPONGE LAP 18X18 X RAY DECT (DISPOSABLE) ×2 IMPLANT
SUT ETHILON 2 0 FS 18 (SUTURE) IMPLANT
SUT MON AB 5-0 PS2 18 (SUTURE) ×2 IMPLANT
SUT SILK 3 0 TIES 17X18 (SUTURE)
SUT SILK 3-0 18XBRD TIE BLK (SUTURE) IMPLANT
SUT VICRYL 3-0 CR8 SH (SUTURE) ×4 IMPLANT
SYR CONTROL 10ML LL (SYRINGE) ×4 IMPLANT
TOWEL OR 17X24 6PK STRL BLUE (TOWEL DISPOSABLE) ×2 IMPLANT
TOWEL OR NON WOVEN STRL DISP B (DISPOSABLE) IMPLANT
TUBE CONNECTING 20X1/4 (TUBING) ×2 IMPLANT
YANKAUER SUCT BULB TIP NO VENT (SUCTIONS) ×4 IMPLANT

## 2012-12-31 NOTE — Anesthesia Procedure Notes (Signed)
Procedure Name: LMA Insertion Performed by: Falcon Mccaskey W Pre-anesthesia Checklist: Patient identified, Timeout performed, Emergency Drugs available, Suction available and Patient being monitored Patient Re-evaluated:Patient Re-evaluated prior to inductionOxygen Delivery Method: Circle system utilized Preoxygenation: Pre-oxygenation with 100% oxygen Intubation Type: IV induction Ventilation: Mask ventilation without difficulty LMA: LMA inserted LMA Size: 4.0 Tube type: Oral Number of attempts: 1 Placement Confirmation: breath sounds checked- equal and bilateral and positive ETCO2 Tube secured with: Tape Dental Injury: Teeth and Oropharynx as per pre-operative assessment      

## 2012-12-31 NOTE — Op Note (Signed)
12/31/2012  4:42 PM  PATIENT:  [name] DOB: [birth year] MRN: [id number]  PREOP DIAGNOSIS:  Left breast cancer  POSTOP DIAGNOSIS:   Left breast cancer  , 1 o'clock position (T1c, N0)  PROCEDURE:   Procedure(s):  LEFT BREAST LUMPECTOMY WITH NEEDLE LOCALIZATION AND LEFT AXILLARY SENTINEL LYMPH NODE BIOPSY, Injection of peri areolar area of breast with methylene blue (0.5 cc)  (Hot and blue lymph node, counts 3400, background 10)  SURGEON:   Ovidio Kin, M.D.  ANESTHESIA:   general  Anesthesiologist: Kerby Nora, MD CRNA: York Grice, CRNA  General  EBL:  <50 cc  ml  DRAINS: none   LOCAL MEDICATIONS USED:   30 cc 1/4% marcaine  SPECIMEN:   Left breast lumpectomy, superior-medial margin, left axillary sentinel lymph node   COUNTS CORRECT:  YES  INDICATIONS FOR PROCEDURE:  [name] is a 57 y.o. (DOB: 1956-03-31) white  female whose primary care physician is [name], MD and comes for left breast lumpectomy and left axillary sentinel lymph node biopsy.   Options for breast cancer treatment were discussed with the patient. She elected to proceed with lumpectomy and axillary sentinel lymph node.  She also asked questions about the mammosite, but decided against it.    The indications and potential complications of surgery were explained to the patient. Potential complications include, but are not limited to, bleeding, infection, the need for further surgery, and nerve injury.     The patient had a needle loc wire placed at the 1 o'clock position of the left breast at [facility].  In the holding area, her left areola was injected with 1 millicurie of Technitium Sulfur Colloid.  OPERATIVE NOTE:  The patient was taken to room # 8 at [facility] where she underwent a general anesthesia  supervised by Anesthesiologist: Kerby Nora, MD CRNA: York Grice, CRNA. Her left breast and axilla were prepped with  ChloraPrep and sterilely draped.   A  time-out and the surgical check list was reviewed.   I injected about 0.5 mL of 40% methylene blue around her left areola.  I started with the left axillary sentinel lymph node biopsy. I found a hot area at the junction of the breast and the pectoralis major muscle. I cut down and  identified a hot node that had counts of 3,400 and the background has 10 counts. The lymph node was blue. I checked her internal mammary nodes and supraclavicular nodes with the neoprobe and found no other hot area. The axillary node was then sent to pathology.   I turned attention to the cancer which was about at the 1 o'clock position of the left breast. I cut down around the wire and tried to take an ellipse of breast tissue around the tumor by at least 1 cm.   I excised this block of breast tissue approximately 4 cm by 5 cm  in diameter. I took the dissection down to almost the pectoralis major. I painted the lumpectomy specimen with the 6 color paint kit and did a specimen mammogram which confirmed the mass, clip and the wire were all in the right position.   I then irrigated the wound with saline. I infiltrated approximately 30 mL of 1% local between the  Incisions.  I placed 6 clips to mark biopsy cavity, at 12, 3, 6, and 9 o'clock. Two clips were placed on the pectoralis major.   I then closed all the wounds in layers  using 3-0 Vicryl sutures for the deep layer. At the skin, I closed the incisions with a 5-0 Monocryl suture. The incisions were then painted with Dermabond.  She had gauze place over the wounds and placed in a breast binder.  The patient tolerated the procedure well, was transported to the recovery room in good condition. Sponge and needle count were correct at the end of the case.   Final pathology is pending.   Ovidio Kin, MD, [facility] Pager: [phone] Office phone:  [phone]

## 2012-12-31 NOTE — H&P (View-Only) (Signed)
Re:   [name] DOB:   [birth year] MRN:   [id number]  [facility]  ASSESSMENT AND PLAN: 1.  Left breast cancer - at 1 o'clock  1.5 cm on mammogram, 2.0 cm on MRI  IDC, Grade 2, ER +, PR +, Her2Neu - neg., Ki67 - 10%.  Oncology - Magrinat and Michell Heinrich.  I discussed the options for breast cancer treatment with the patient.  The patient is in the multidisciplinary clinic and understands the treatment of breast cancer includes medical oncology and radiation oncology.  I discussed the surgical options of lumpectomy vs. mastectomy.  If mastectomy, there is the possibility of reconstruction.   I discussed the options of lymph node biopsy.  The treatment plan depends on the pathologic staging of the tumor and the patient's personal wishes.  The risks of surgery include, but are not limited to, bleeding, infection, the need for further surgery, and nerve injury.  Plan: 1) left breast lumpectomy and left SLNBx, 2.)  Oncotype  2.  History of int carotid stenosis (secondary to fibromuscular dysplasia) - she underwent carotid artery dilatation in 2003.  Drs. Dohmeir and Tour manager on chronic aspirin for this  [Okay from Dr. Richardean Chimera to stop aspirin for 5 days prior to surgery.  DN 12/23/2012] 3.  On chronic aspirin for #2.  She is going to check with Dr. Vickey Huger about stopping the aspirin in anticipation of surgery 4.  Recent rectal bleeding - secondary to hemorrhoids.  Will check in the office - did not have the right supplies at the [facility]. 5.  Hypercholesterolemia   REFERRING PHYSICIAN: [name], MD  HISTORY OF PRESENT ILLNESS: [name] is a 57 y.o. (DOB: 1956/01/02)  white  female whose primary care physician is [name], MD and comes to [facility] for a new left breast cancer.  The patient went for her routine mammogram.  Her last mammogram was about 15 months ago.  The mammogram at [facility] showed a 1.5 x 1.3 cm mass at the 1 o'clock position.  A  core biopsy showed an Invasive Ductal Carcinoma.  A breast MRI on 12/18/2012 showed a 2.0 x 1.4 cm mass in the UOQ of the left breast. She has a cousin who had breast cancer, but no first degree relative.  She went 9 months without a period, then recently had a small period.  She has seen Dr. Rosealee Albee for this.  He is checking some blood work to see if she has gone through menopause.  She is not on hormone meds.   No past medical history on file.    Past Surgical History  Procedure Laterality Date  . Back surgery        Current Outpatient Prescriptions  Medication Sig Dispense Refill  . aspirin 81 MG tablet Take 81 mg by mouth daily.      Marland Kitchen atorvastatin (LIPITOR) 10 MG tablet Take 10 mg by mouth daily.      . Calcium Carbonate-Vitamin D (CALCIUM + D PO) Take by mouth daily.      . citalopram (CELEXA) 10 MG tablet Take 10 mg by mouth daily.      . Fluticasone Propionate (FLONASE NA) Place into the nose.      . Multiple Vitamins-Minerals (CENTRUM SILVER ADULT 50+ PO) Take by mouth.       No current facility-administered medications for this visit.     No Known Allergies  REVIEW OF SYSTEMS: Skin:  No history of rash.  No history of abnormal moles. Infection:  No history of hepatitis or HIV.  No history of MRSA. Neurologic:  Prior carotid artery dilatation around 2002.  Followed by Dr. Vickey Huger. Cardiac:  No history of hypertension. No history of heart disease.  No history of seeing a cardiologist. Pulmonary:  Does not smoke cigarettes.  No asthma or bronchitis.  No OSA/CPAP.  Endocrine:  No diabetes. No thyroid disease.  Hypercholesterolemia. Gastrointestinal:  No history of stomach disease.  No history of liver disease.  No history of gall bladder disease.  No history of pancreas disease.  No history of colon disease.  Some rectal bleeding attributed to hemorrhoids.  Negative colonoscopy at age 68 (done at [facility], but she cannot remember the name of the GI doctor). Urologic:  No history of  kidney stones.  No history of bladder infections. Musculoskeletal:  Back surgery, Dr. Dutch Quint, 11/2000 Hematologic:  No bleeding disorder. On aspirin. Psycho-social:  The patient is oriented.   The patient has no obvious psychologic or social impairment to understanding our conversation and plan.  SOCIAL and FAMILY HISTORY: Married. Husband and daughter, [name], with patient. Husband had lymphoma treated by Dr. Gaylyn Rong.  Someone in my group did the original abdominal biopsy.  PHYSICAL EXAM: There were no vitals taken for this visit.  General: WN WF who is alert and generally healthy appearing.  HEENT: Normal. Pupils equal. Neck: Supple. No mass.  No thyroid mass. Lymph Nodes:  No supraclavicular or cervical nodes. Lungs: Clear to auscultation and symmetric breath sounds. Breasts:  Right - unremarkable  Left - Bruise at edge of areola, about 2 o'clock.  No mass or skin changes.  Heart:  RRR. No murmur or rub. Abdomen: Soft. No mass. No tenderness. No hernia. Normal bowel sounds.  No abdominal scars. Rectal: No external hemorrhoids.  No lubricant to do rectal. Extremities:  Good strength and ROM  in upper and lower extremities. Neurologic:  Grossly intact to motor and sensory function. Psychiatric: Has normal mood and affect. Behavior is normal.   DATA REVIEWED: Epic and notes in chart.  Ovidio Kin, MD,  [facility] [address] [place]  [address]   [place], [place]    [address] Phone:  [phone] FAX:  [phone]

## 2012-12-31 NOTE — Transfer of Care (Signed)
Immediate Anesthesia Transfer of Care Note  Patient: [name]  Procedure(s) Performed: Procedure(s): LEFT BREAST LUMPECTOMY WITH NEEDLE LOCALIZATION AND LEFT AXILLARY SENTINEL LYMPH NODE BIOPSY (Left)  Patient Location: PACU  Anesthesia Type:General  Level of Consciousness: awake and sedated  Airway & Oxygen Therapy: Patient Spontanous Breathing and Patient connected to face mask oxygen  Post-op Assessment: Report given to PACU RN and Post -op Vital signs reviewed and stable  Post vital signs: Reviewed and stable  Complications: No apparent anesthesia complications

## 2012-12-31 NOTE — Anesthesia Postprocedure Evaluation (Signed)
  Anesthesia Post-op Note  Patient: [name]  Procedure(s) Performed: Procedure(s): LEFT BREAST LUMPECTOMY WITH NEEDLE LOCALIZATION AND LEFT AXILLARY SENTINEL LYMPH NODE BIOPSY (Left)  Patient Location: PACU  Anesthesia Type:General  Level of Consciousness: awake, alert  and oriented  Airway and Oxygen Therapy: Patient Spontanous Breathing and Patient connected to face mask oxygen  Post-op Pain: mild  Post-op Assessment: Post-op Vital signs reviewed  Post-op Vital Signs: Reviewed  Complications: No apparent anesthesia complications

## 2012-12-31 NOTE — Anesthesia Preprocedure Evaluation (Signed)
Anesthesia Evaluation  Patient identified by MRN, date of birth, ID band Patient awake    Reviewed: Allergy & Precautions, H&P , NPO status , Patient's Chart, lab work & pertinent test results  History of Anesthesia Complications (+) PONV  Airway Mallampati: I TM Distance: >3 FB     Dental  (+) Teeth Intact and Dental Advisory Given   Pulmonary  breath sounds clear to auscultation        Cardiovascular Rhythm:Regular Rate:Normal     Neuro/Psych    GI/Hepatic   Endo/Other    Renal/GU      Musculoskeletal   Abdominal   Peds  Hematology   Anesthesia Other Findings   Reproductive/Obstetrics                           Anesthesia Physical Anesthesia Plan  ASA: II  Anesthesia Plan: General   Post-op Pain Management:    Induction: Intravenous  Airway Management Planned: LMA  Additional Equipment:   Intra-op Plan:   Post-operative Plan: Extubation in OR  Informed Consent: I have reviewed the patients History and Physical, chart, labs and discussed the procedure including the risks, benefits and alternatives for the proposed anesthesia with the patient or authorized representative who has indicated his/her understanding and acceptance.   Dental advisory given  Plan Discussed with: CRNA, Anesthesiologist and Surgeon  Anesthesia Plan Comments:         Anesthesia Quick Evaluation

## 2012-12-31 NOTE — Interval H&P Note (Signed)
History and Physical Interval Note:  12/31/2012 2:52 PM  [name]  has presented today for surgery, with the diagnosis of Left breast cancer  The various methods of treatment have been discussed with the patient and family.  She thought about a mammosite, but decided against it.  Both Dr. Michell Heinrich and I talked to the patient about the procedure.  Her husband and daughter are at the bedside.  After consideration of risks, benefits and other options for treatment, the patient has consented to  Procedure(s): LEFT BREAST LUMPECTOMY WITH NEEDLE LOCALIZATION AND LEFT AXILLARY SENTINEL LYMPH NODE BIOPSY (Left) as a surgical intervention .  The patient's history has been reviewed, patient examined, no change in status, stable for surgery.    I have reviewed the patient's chart and labs.  Questions were answered to the patient's satisfaction.     Hamp Moreland H

## 2013-01-01 ENCOUNTER — Encounter (HOSPITAL_BASED_OUTPATIENT_CLINIC_OR_DEPARTMENT_OTHER): Payer: Self-pay | Admitting: Surgery

## 2013-01-01 NOTE — Progress Notes (Signed)
Dear Juanetta Beets,  I am glad to hear that Mrs. Kadlec's surgery went well. 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 Please let me know if anything neurologic needs to be addressed.   Sincerely, Melvyn Novas M.D.

## 2013-01-02 ENCOUNTER — Other Ambulatory Visit: Payer: Managed Care, Other (non HMO)

## 2013-01-03 ENCOUNTER — Telehealth (INDEPENDENT_AMBULATORY_CARE_PROVIDER_SITE_OTHER): Payer: Self-pay

## 2013-01-03 NOTE — Telephone Encounter (Signed)
Pt calling for her pathology results today. Pls call her.

## 2013-01-08 ENCOUNTER — Encounter: Payer: Self-pay | Admitting: *Deleted

## 2013-01-08 NOTE — Progress Notes (Signed)
Oncotype Dx order sent to pathology.  Received confirmation from Tammy that order was received.

## 2013-01-15 ENCOUNTER — Encounter: Payer: Self-pay | Admitting: *Deleted

## 2013-01-15 DIAGNOSIS — C50919 Malignant neoplasm of unspecified site of unspecified female breast: Secondary | ICD-10-CM | POA: Insufficient documentation

## 2013-01-15 NOTE — Progress Notes (Signed)
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 Lymph node, sentinel, biopsy, Left axillary- ISOLATED TUMOR CELLS IN 1 OF 1 LYMPH NODE.- SEE COMMENT. 2. Breast, lumpectomy, Left- INVASIVE DUCTAL CARCINOMA WITH CALCIFICATIONS GRADE II/III, SPANNING 1.5 CM.- DUCTAL CARCINOMA IN SITU, LOW GRADE.- LYMPHOVASCULAR INVASION IS IDENTIFIED. - THE SURGICAL RESECTION MARGINS ARE NEGATIVE FOR CARCINOMA., DR. Ovidio Kin, next office visit=01/18/13  Past/Anticipated interventions by medical oncology, if any: Chemotherapy:seen multidisciplinary breast clinic 12/19/12 next office visit with Dr.Magrinat 01/29/13  Lymphedema issues, if any:  no Pain issues, if any:   SAFETY ISSUES:  Prior radiation? no  Pacemaker/ICD? no  Possible current pregnancy?no  Is the patient on methotrexate? 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

## 2013-01-16 ENCOUNTER — Encounter: Payer: Self-pay | Admitting: Radiation Oncology

## 2013-01-16 ENCOUNTER — Telehealth (INDEPENDENT_AMBULATORY_CARE_PROVIDER_SITE_OTHER): Payer: Self-pay

## 2013-01-16 ENCOUNTER — Ambulatory Visit: Payer: Managed Care, Other (non HMO) | Admitting: Radiation Oncology

## 2013-01-16 ENCOUNTER — Ambulatory Visit: Payer: Managed Care, Other (non HMO)

## 2013-01-16 NOTE — Telephone Encounter (Signed)
Patient sched.

## 2013-01-16 NOTE — Progress Notes (Signed)
Patient has not received oncotype results yet. Has appt with medical oncology 7/29.  I told her not to come today and we will reschedule for first week of August after she has talked to Dr. Darnelle Catalan.

## 2013-01-16 NOTE — Telephone Encounter (Signed)
Pt calling to report that she noticed this past weekend when she removed her bra she saw some yellow drainage in her bra. The pt states there is no odor from the drainage. The pt had a left lumpectomy on 6/30 by Dr Ezzard Standing. The pt states she really has no other symptoms going on with the drainage. The pt has a f/u appt with Dr Ezzard Standing on 7/18 for her first post op appt. I advised if any new info. We will contact the pt back.

## 2013-01-17 ENCOUNTER — Telehealth: Payer: Self-pay

## 2013-01-17 ENCOUNTER — Encounter: Payer: Self-pay | Admitting: *Deleted

## 2013-01-17 NOTE — Telephone Encounter (Signed)
1)    I spoke with patient re: pre-surgical letter. She stated that received the information they needed (through [facility]). I will go ahead and send her a copy of the letter Dr. Vickey Huger generated.  2)    Patient would like to know when she should have her follow-up appointment with Dr. Vickey Huger. Please call her today or tomorrow. Thank you.

## 2013-01-17 NOTE — Telephone Encounter (Signed)
Clarified  No temp,redness at the  Incision site, odor. Patient states she is having small  clear driange from the incision area without any odor. Advised her the clear drainage was normal. Informed her she has a appointment 7-18- 14 @ 3:50  To call if she has any concerns or changes in her condition . Patient verbalized understanding

## 2013-01-17 NOTE — Progress Notes (Signed)
Received Oncotype Dx results of 13.  Emailed results to MD and placed a copy in his box.  Took a copy to Med Rec to scan.

## 2013-01-18 ENCOUNTER — Encounter (INDEPENDENT_AMBULATORY_CARE_PROVIDER_SITE_OTHER): Payer: Self-pay | Admitting: Surgery

## 2013-01-18 ENCOUNTER — Ambulatory Visit (INDEPENDENT_AMBULATORY_CARE_PROVIDER_SITE_OTHER): Payer: Commercial Indemnity | Admitting: Surgery

## 2013-01-18 VITALS — BP 118/62 | HR 70 | Resp 14 | Ht 64.0 in | Wt 174.2 lb

## 2013-01-18 DIAGNOSIS — C50419 Malignant neoplasm of upper-outer quadrant of unspecified female breast: Secondary | ICD-10-CM

## 2013-01-18 DIAGNOSIS — C50412 Malignant neoplasm of upper-outer quadrant of left female breast: Secondary | ICD-10-CM

## 2013-01-18 NOTE — Progress Notes (Signed)
Re:   [name] DOB:   [birth year] MRN:   [id number]  [facility]  ASSESSMENT AND PLAN: 1.  Left breast cancer - at 1 o'clock  Path - 1.5 IDC, Grade 2, ER 100%, PR 100%, Her2Neu - neg., Ki67 - 12%. 0/1 nodes (but node had isolated tumor cells)  Oncology - Magrinat and Michell Heinrich.  Left breast lumpectomy and left axillary SLNBx - 12/31/2012  Oncotype - 13, recurrence risk 8%.  She'll see me back in 6 months.  If she still has some drainage from her breast over 1 week, she'll call back and I will check her wound.   2.  History of int carotid stenosis (secondary to fibromuscular dysplasia) - she underwent carotid artery dilatation in 2003.  Drs. Dohmeir and Tour manager on chronic aspirin for this 3.  On chronic aspirin for #2.  She is going to check with Dr. Vickey Huger about stopping the aspirin in anticipation of surgery 4.  Recent rectal bleeding -  Her last colonoscopy about 6 years ago by Dr. Josefa Half.  Rectal exam today is negative. 5.  Hypercholesterolemia   REFERRING PHYSICIAN: [name], MD  HISTORY OF PRESENT ILLNESS: [name] is a 57 y.o. (DOB: 16-Jun-1956)  white  female whose primary care physician is [name], MD and comes for follow up of a left breast lumpecotmy.  She comes by herself today.  She has done well, except for some drainage from her breast incision.  But the drainage has almost stopped.  She's had no fever.  She has a little bit of a rash under her left arm.  History of Breast Cancer: The patient went for her routine mammogram.  Her last mammogram was about 15 months ago.  The mammogram at [facility] showed a 1.5 x 1.3 cm mass at the 1 o'clock position.  A core biopsy showed an Invasive Ductal Carcinoma.  A breast MRI on 12/18/2012 showed a 2.0 x 1.4 cm mass in the UOQ of the left breast. She has a cousin who had breast cancer, but no first degree relative.  She went 9 months without a period, then recently had a small period.  She  has seen Dr. Rosealee Albee for this.  He is checking some blood work to see if she has gone through menopause.  She is not on hormone meds.   Past Medical History  Diagnosis Date  . PONV (postoperative nausea and vomiting)   . Carotid artery stenosis     hx-dr dohmier monitors  . Wears glasses   . Seasonal allergies   . Breast cancer 12/12/12 bx    left breast     Current Outpatient Prescriptions  Medication Sig Dispense Refill  . aspirin 325 MG tablet Take 325 mg by mouth daily.      Marland Kitchen atorvastatin (LIPITOR) 10 MG tablet Take 10 mg by mouth daily.      . Calcium Carbonate-Vitamin D (CALCIUM + D PO) Take by mouth daily.      . citalopram (CELEXA) 10 MG tablet Take 10 mg by mouth daily.      . Fluticasone Propionate (FLONASE NA) Place into the nose.      . Multiple Vitamins-Minerals (CENTRUM SILVER ADULT 50+ PO) Take by mouth.       No current facility-administered medications for this visit.     No Known Allergies  REVIEW OF SYSTEMS: Neurologic:  Prior carotid artery dilatation around 2002.  Followed by Dr. Vickey Huger. Endocrine:  No diabetes. No  thyroid disease.  Hypercholesterolemia. Gastrointestinal:  No history of stomach disease.  No history of liver disease.  No history of gall bladder disease.  No history of pancreas disease.  No history of colon disease.  Some rectal bleeding attributed to hemorrhoids.  Negative colonoscopy at age 89 - Dr. Josefa Half. Urologic:  No history of kidney stones.  No history of bladder infections. Musculoskeletal:  Back surgery, Dr. Dutch Quint, 11/2000 Hematologic:  No bleeding disorder. On aspirin.  SOCIAL and FAMILY HISTORY: Married. Husband and daughter, [name], with patient. Husband had lymphoma treated by Dr. Gaylyn Rong.  Someone in my group did the original abdominal biopsy.  PHYSICAL EXAM: BP 118/62  Pulse 70  Resp 14  Ht 5\' 4"  (1.626 m)  Wt 174 lb 3.2 oz (79.017 kg)  BMI 29.89 kg/m2  General: WN WF who is alert and generally healthy appearing.  Neck:  Supple. No mass.  No thyroid mass. Breasts:  Right - unremarkable  Left - Upper outer wound looks good.  There may be some minimal drainage at the outer aspect of the wound.  She has a mild rash in her left axilla. Rectal: No obvious hemorrhoids. No mass. Guaiac today is negative.  DATA REVIEWED: Path to patient.  Ovidio Kin, MD,  [facility] [address] [place]  [address]   [place], [place]    [address] Phone:  [phone] FAX:  [phone]

## 2013-01-29 ENCOUNTER — Telehealth: Payer: Self-pay | Admitting: Oncology

## 2013-01-29 ENCOUNTER — Ambulatory Visit (HOSPITAL_BASED_OUTPATIENT_CLINIC_OR_DEPARTMENT_OTHER): Payer: Commercial Indemnity | Admitting: Oncology

## 2013-01-29 VITALS — BP 147/86 | HR 68 | Temp 98.7°F | Resp 20 | Ht 64.0 in | Wt 174.1 lb

## 2013-01-29 DIAGNOSIS — C50419 Malignant neoplasm of upper-outer quadrant of unspecified female breast: Secondary | ICD-10-CM

## 2013-01-29 MED ORDER — CEPHALEXIN 500 MG PO CAPS: 500.0000 mg | ORAL_CAPSULE | Freq: Four times a day (QID) | ORAL | Status: AC

## 2013-01-29 NOTE — Progress Notes (Signed)
ID: [name] OB: [birth year]  MR#: [id number]  CSN#:[id number]  PCP: [name], MD GYN:  Annamaria Helling SU: Ovidio Kin OTHER MD: Lurline Hare, Karene Fry, Porfirio Mylar Dohmeier   HISTORY OF PRESENT ILLNESS: Na had routine mammography/tomography 11/28/2012 suggesting and abnormality in the left breast. Diagnostic left mammography and ultrasonography at the [facility] Center 12/07/2012 confirmed a density in the upper outer quadrant of the left breast, which was not palpable by exam. Ultrasound showed an irregular hypoechoic lesion in the left breast measuring 1.5 cm. The left axilla was sonographically benign.  Biopsy of the left breast mass in question 12/12/2012 showed an invasive ductal carcinoma, grade 2, estrogen and progesterone receptor positive, HER-2 not amplified, with an MIB-1 of approximately 10%. Breast MRI 12/18/2012 measured the left breast mass at 2.0 cm. There were no other masses of concern, and no suspicious internal mammary or axillary lymph nodes identified.  The patient's subsequent history is as detailed below.  INTERVAL HISTORY: Channelle returns today for followup of her breast cancer. Since her last visit here she had her definitive surgery, which confirmed a stage IA invasive ductal carcinoma with a low Oncotype score.  REVIEW OF SYSTEMS: She did well initially from the surgery, then had some serous leakage through the breast incision. This eventually stopped, but then 2 days ago she had swelling of the left breast erythema and significant tenderness. She did not bring this to Dr. Allene Pyo attention. She says this is now "better". Aside from the left breast issues, she is having some hot flashes, which are not new. A detailed review of systems was otherwise noncontributory  PAST MEDICAL HISTORY: Past Medical History  Diagnosis Date  . PONV (postoperative nausea and vomiting)   . Carotid artery stenosis     hx-dr dohmier monitors  . Wears glasses   . Seasonal  allergies   . Breast cancer 12/12/12 bx    left breast  Bilateral carotid artery stenosis, status post dilatation  PAST SURGICAL HISTORY: Past Surgical History  Procedure Laterality Date  . Back surgery    . Wisdom tooth extraction    . Nasal polyp surgery    . Breast lumpectomy with needle localization and axillary sentinel lymph node bx Left 12/31/2012    Procedure: LEFT BREAST LUMPECTOMY WITH NEEDLE LOCALIZATION AND LEFT AXILLARY SENTINEL LYMPH NODE BIOPSY;  Surgeon: Kandis Cocking, MD;  Location: [facility];  Service: General;  Laterality: Left;  . Colonoscopy  2007   removal of a nasal polyp  FAMILY HISTORY Family History  Problem Relation Age of Onset  . Uterine cancer Mother   . Brain cancer Paternal Grandfather   . Breast cancer Cousin   . Melanoma Daughter    the patient's father died from a stroke at age 52. The patient's mother is living, currently age 17. She was diagnosed with cancer of the uterus at age 37. The patient had one grandfather diagnosed with cancer of the brain. The patient has one maternal cousin diagnosed with breast cancer in her 57s. The patient herself has 3 brothers and 3 sisters, none with cancer. The patient's daughter was diagnosed with melanoma in situ at the age of 48. She also has a history of DVT. There is no history of ovarian cancer in the family.  GYNECOLOGIC HISTORY:  Menarche age 25, first live birth age 45, the patient is GX P3. Her periods are now very irregular, she just had one 12/16/2012, the one prior to that was 03/18/2012.  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 Her husband Casimiro Needle "Kathlene November" [place], works for [employer] in Designer, fashion/clothing ("I practically live in [place]"). Daughter Hospital doctor lives in [place] where she works as an a Radio producer. Son [name]") lives in [place] where he works as an Theatre stage manager. Son [name] is in high school and lives with the patient at home. There are no  grandchildren. The patient attends a local [address].    ADVANCED DIRECTIVES: Not in place   HEALTH MAINTENANCE: History  Substance Use Topics  . Smoking status: Never Smoker   . Smokeless tobacco: Never Used  . Alcohol Use: Yes     Comment: rare     Colonoscopy: 2007/ [facility]  PAP: June 2014  Bone density: Never  Lipid panel:  No Known Allergies  Current Outpatient Prescriptions  Medication Sig Dispense Refill  . aspirin 325 MG tablet Take 325 mg by mouth daily.      Marland Kitchen atorvastatin (LIPITOR) 10 MG tablet Take 10 mg by mouth daily.      . Calcium Carbonate-Vitamin D (CALCIUM + D PO) Take by mouth daily.      . citalopram (CELEXA) 10 MG tablet Take 10 mg by mouth daily.      . Fluticasone Propionate (FLONASE NA) Place into the nose.      . Multiple Vitamins-Minerals (CENTRUM SILVER ADULT 50+ PO) Take by mouth.       No current facility-administered medications for this visit.    OBJECTIVE: Middle-aged white woman who appears well Filed Vitals:   01/29/13 1454  BP: 147/86  Pulse: 68  Temp: 98.7 F (37.1 C)  Resp: 20     Body mass index is 29.87 kg/(m^2).    ECOG FS: 1  Sclerae unicteric, pupils equal round and reactive to light Oropharynx clear No cervical or supraclavicular adenopathy Lungs no rales or rhonchi Heart regular rate and rhythm Abd benign MSK no focal spinal tenderness, no left upper extremity edema Neuro: non-focal, well-oriented, appropriate affect Breasts: The right breast is unremarkable. The left breast is status post lumpectomy. It is larger than the right breast, firmer, and erythematous over approximately 2/3 of the breast, anteriorly and superiorly primarily. There is no leakage or seepage through the incision. The left axilla is benign.   LAB RESULTS:  CMP     Component Value Date/Time   NA 141 12/19/2012 0824   K 3.9 12/19/2012 0824   CL 107 12/19/2012 0824   CO2 24 12/19/2012 0824   GLUCOSE 78 12/19/2012 0824   BUN 15.2 12/19/2012  0824   CREATININE 0.8 12/19/2012 0824   CALCIUM 9.5 12/19/2012 0824   PROT 7.2 12/19/2012 0824   ALBUMIN 3.8 12/19/2012 0824   AST 18 12/19/2012 0824   ALT 25 12/19/2012 0824   ALKPHOS 71 12/19/2012 0824   BILITOT 0.61 12/19/2012 0824    I No results found for this basename: SPEP,  UPEP,   kappa and lambda light chains    Lab Results  Component Value Date   WBC 5.9 12/19/2012   NEUTROABS 3.6 12/19/2012   HGB 14.6 12/19/2012   HCT 43.0 12/19/2012   MCV 88.6 12/19/2012   PLT 208 12/19/2012      Chemistry      Component Value Date/Time   NA 141 12/19/2012 0824   K 3.9 12/19/2012 0824   CL 107 12/19/2012 0824   CO2 24 12/19/2012 0824   BUN 15.2 12/19/2012 0824   CREATININE 0.8 12/19/2012 0824  Component Value Date/Time   CALCIUM 9.5 12/19/2012 0824   ALKPHOS 71 12/19/2012 0824   AST 18 12/19/2012 0824   ALT 25 12/19/2012 0824   BILITOT 0.61 12/19/2012 0824       No results found for this basename: LABCA2    No components found with this basename: LABCA125    No results found for this basename: INR,  in the last 168 hours  Urinalysis No results found for this basename: colorurine,  appearanceur,  labspec,  phurine,  glucoseu,  hgbur,  bilirubinur,  ketonesur,  proteinur,  urobilinogen,  nitrite,  leukocytesur    STUDIES: Nm Sentinel Node Inj-no Rpt (breast)  12/31/2012   CLINICAL DATA: left breast cancer   Sulfur colloid was injected intradermally by the nuclear medicine  technologist for breast cancer sentinel node localization.    [employer] [facility] Breast Loc Dev   1st Lesion  Inc US Guide  12/31/2012   *RADIOLOGY REPORT*  Clinical Data:  Left breast cancer for left breast surgery.  NEEDLE LOCALIZATION USING ULTRASOUND GUIDANCE AND SPECIMEN RADIOGRAPH  Comparison: Previous exams.  Patient presents for needle localization prior to left breast surgery.  I met with the patient and we discussed the procedure of needle localization including benefits and alternatives. We discussed the high  likelihood of a successful procedure. We discussed the risks of the procedure, including infection, bleeding, tissue injury, and further surgery. Informed, written consent was given. The usual time-out protocol was performed immediately prior to the procedure.  Using ultrasound guidance, sterile technique, 2% lidocaine and a 5 cm modified Kopans needle, mass in the left breast one o'clock 3 cm from nipple localized using a lateral approach.  The films are marked for Dr. Ezzard Standing.  Specimen radiograph is performed at day surgery, and confirms mass, biopsy clip, wire present in the tissue sample.  The specimen is marked for pathology.  IMPRESSION: Needle localization left breast.  No apparent complications.   Original Report Authenticated By: Sherian Rein, M.D.   ASSESSMENT: 57 y.o. [place], [place] woman status post left breast biopsy 12/12/2012 for a clinical T1c N0, stage IA invasive ductal carcinoma, grade 2, estrogen and progesterone receptor positive, with no HER-2 amplification, and an MIB-1 of approximately 10%.  (1) left lumpectomy and sentinel lymph node sampling 12/31/2012 showed a pT1c pN0(i+), stage IIA invasive ductal carcinoma, grade 2, with repeat HER-2 again negative  (2) Oncotype recurrence score of 13 predicts a cane year risk of distant recurrence of 8% if the patient's only systemic treatment is tamoxifen for 5 years  PLAN: I think Delsie is having fluid reaccumulation with mastitis in the left breast, however I cannot be sure there is not infection as well. Accordingly I am starting her on cephalexin. I will see if Dr. Ezzard Standing can take a look at her left breast on Friday, after she has been on antibiotics at least 48 hours. She may or may not need drainage at that time.  Aside from the local breast issue, we discussed her pathology report in detail and she understands that patients with only isolated tumor cells in their lymph nodes do just as well as patients are completely negative lymph  nodes. She also understands that she can do better than the 8% risk of distant recurrence predicted by Oncotype if she takes anastrozole. She is particularly interested in anastrozole because of concerns regarding clots with tamoxifen. However she is not fully postmenopausal. Accordingly when she sees me again in October we will discuss Zoladex and  anastrozole in combination which was recently documented to be particularly effective in pre-or perimenopausal women.  She is aware that women with a low Oncotype score essentially derive no benefit from chemotherapy and therefore chemotherapy is not planned in her case. Her next step is radiation treatments and she is already scheduled to see Dr. Michell Heinrich in approximately 10 days.  She knows to call for any problems that may develop before the next visit here. Lowella Dell, MD   01/29/2013 3:23 PM

## 2013-01-29 NOTE — Telephone Encounter (Signed)
, °

## 2013-01-31 ENCOUNTER — Ambulatory Visit (INDEPENDENT_AMBULATORY_CARE_PROVIDER_SITE_OTHER): Payer: Commercial Indemnity | Admitting: Surgery

## 2013-01-31 ENCOUNTER — Encounter (INDEPENDENT_AMBULATORY_CARE_PROVIDER_SITE_OTHER): Payer: Self-pay | Admitting: Surgery

## 2013-01-31 VITALS — BP 118/72 | HR 76 | Temp 98.0°F | Resp 15 | Ht 64.5 in | Wt 172.6 lb

## 2013-01-31 DIAGNOSIS — C50412 Malignant neoplasm of upper-outer quadrant of left female breast: Secondary | ICD-10-CM

## 2013-01-31 DIAGNOSIS — C50419 Malignant neoplasm of upper-outer quadrant of unspecified female breast: Secondary | ICD-10-CM

## 2013-01-31 NOTE — Progress Notes (Signed)
Re:   [name] DOB:   August 08, 1955 MRN:   [id number]  [facility]  ASSESSMENT AND PLAN: 1.  Left breast cancer - at 1 o'clock  Path - 1.5 IDC, Grade 2, ER 100%, PR 100%, Her2Neu - neg., Ki67 - 12%. 0/1 nodes (but node had isolated tumor cells)  Oncology - Magrinat and Michell Heinrich.  Left breast lumpectomy and left axillary SLNBx - 12/31/2012  Oncotype - 13, recurrence risk 8%.  1a. Left breast wound drainage.  Started on Keflex by Dr. Darnelle Catalan on 01/29/2013  Seroma of left breast wound - aspirated today.  I got out 20 cc.  Return to see me in 2 weeks.  2.  History of int carotid stenosis (secondary to fibromuscular dysplasia) - she underwent carotid artery dilatation in 2003.  Drs. Dohmeir and Tour manager on chronic aspirin for this 3.  On chronic aspirin for #2.  She is going to check with Dr. Vickey Huger about stopping the aspirin in anticipation of surgery 4.  Hypercholesterolemia   REFERRING PHYSICIAN: [name], MD  HISTORY OF PRESENT ILLNESS: [name] is a 57 y.o. (DOB: 1955/09/06)  white  female whose primary care physician is [name], MD and comes for follow up of a left breast lumpecotmy.  She comes by herself today.  She had some redness of her left breast on Sunday, 01/27/2013.  She saw Dr. Darnelle Catalan on 01/29/2013.  He put her on Keflex.  The breast is not draining and it seems better.  History of Breast Cancer: The patient went for her routine mammogram.  Her last mammogram was about 15 months ago.  The mamm[facility] showed a 1.5 x 1.3 cm mass at the 1 o'clock position.  A core biopsy showed an Invasive Ductal Carcinoma.  A breast MRI on 12/18/2012 showed a 2.0 x 1.4 cm mass in the UOQ of the left breast. She has a cousin who had breast cancer, but no first degree relative.  She went 9 months without a period, then recently had a small period.  She has seen Dr. Rosealee Albee for this.  He is checking some blood work to see if she has gone through  menopause.  She is not on hormone meds.   Past Medical History  Diagnosis Date  . PONV (postoperative nausea and vomiting)   . Carotid artery stenosis     hx-dr dohmier monitors  . Wears glasses   . Seasonal allergies   . Breast cancer 12/12/12 bx    left breast     Current Outpatient Prescriptions  Medication Sig Dispense Refill  . aspirin 325 MG tablet Take 325 mg by mouth daily.      Marland Kitchen atorvastatin (LIPITOR) 10 MG tablet Take 10 mg by mouth daily.      . Calcium Carbonate-Vitamin D (CALCIUM + D PO) Take by mouth daily.      . cephALEXin (KEFLEX) 500 MG capsule Take 1 capsule (500 mg total) by mouth 4 (four) times daily.  40 capsule  0  . citalopram (CELEXA) 10 MG tablet Take 10 mg by mouth daily.      . Fluticasone Propionate (FLONASE NA) Place into the nose.      . Multiple Vitamins-Minerals (CENTRUM SILVER ADULT 50+ PO) Take by mouth.       No current facility-administered medications for this visit.     No Known Allergies  REVIEW OF SYSTEMS: Neurologic:  Prior carotid artery dilatation around 2002.  Followed by Dr. Vickey Huger. Endocrine:  No diabetes. No thyroid disease.  Hypercholesterolemia. Gastrointestinal:  No history of stomach disease.  No history of liver disease.  No history of gall bladder disease.  No history of pancreas disease.  No history of colon disease.  Some rectal bleeding attributed to hemorrhoids.  Negative colonoscopy at age 65 - Dr. Josefa Half. Urologic:  No history of kidney stones.  No history of bladder infections. Musculoskeletal:  Back surgery, Dr. Dutch Quint, 11/2000 Hematologic:  No bleeding disorder. On aspirin.  SOCIAL and FAMILY HISTORY: Married. Husband and daughter, [name], with patient. Husband had lymphoma treated by Dr. Gaylyn Rong.  Someone in my group did the original abdominal biopsy.  PHYSICAL EXAM: BP 118/72  Pulse 76  Temp(Src) 98 F (36.7 C) (Temporal)  Resp 15  Ht 5' 4.5" (1.638 m)  Wt 172 lb 9.3 oz (78.282 kg)  BMI 29.18  kg/m2  General: WN WF who is alert and generally healthy appearing.  Neck: Supple. No mass.  No thyroid mass. Breasts:  Right - unremarkable  Left - There is some fluid in the wound.  I aspirated 20 cc of straw colored fluid.  So the fluid does not seem infected.  She will continue the antibiotics that Dr. Darnelle Catalan started.  DATA REVIEWED: Epic notes.  Ovidio Kin, MD,  [facility] [address]  [address]   [place], [place]    [address] Phone:  [phone] FAX:  [phone]

## 2013-02-08 ENCOUNTER — Ambulatory Visit
Admission: RE | Admit: 2013-02-08 | Discharge: 2013-02-08 | Disposition: A | Discharge status: Home / Self Care | Payer: 59 | Source: Ambulatory Visit | Attending: Radiation Oncology | Admitting: Radiation Oncology

## 2013-02-08 ENCOUNTER — Encounter: Payer: Self-pay | Admitting: Radiation Oncology

## 2013-02-08 ENCOUNTER — Ambulatory Visit
Admission: RE | Admit: 2013-02-08 | Discharge: 2013-02-08 | Disposition: A | Discharge status: Home / Self Care | Payer: Commercial Indemnity | Source: Ambulatory Visit | Attending: Radiation Oncology | Admitting: Radiation Oncology

## 2013-02-08 VITALS — BP 127/80 | HR 60 | Temp 97.7°F | Resp 20 | Ht 64.5 in | Wt 173.3 lb

## 2013-02-08 DIAGNOSIS — C50919 Malignant neoplasm of unspecified site of unspecified female breast: Secondary | ICD-10-CM | POA: Insufficient documentation

## 2013-02-08 DIAGNOSIS — C50412 Malignant neoplasm of upper-outer quadrant of left female breast: Secondary | ICD-10-CM

## 2013-02-08 DIAGNOSIS — Z510 Encounter for antineoplastic radiation therapy: Secondary | ICD-10-CM | POA: Insufficient documentation

## 2013-02-08 DIAGNOSIS — N6459 Other signs and symptoms in breast: Secondary | ICD-10-CM | POA: Insufficient documentation

## 2013-02-08 DIAGNOSIS — C50419 Malignant neoplasm of upper-outer quadrant of unspecified female breast: Secondary | ICD-10-CM | POA: Insufficient documentation

## 2013-02-08 DIAGNOSIS — Z170 Estrogen receptor positive status [ER+]: Secondary | ICD-10-CM | POA: Insufficient documentation

## 2013-02-08 NOTE — Progress Notes (Signed)
Name: [name]   MRN: [id number]  Date:  02/08/2013  DOB: 1955/07/13  Status:outpatient   DIAGNOSIS: Left Breast cancer.  CONSENT VERIFIED: yes SET UP: Patient is setup supine  IMMOBILIZATION:  The following immobilization was used:Custom Moldable Pillow, breast board.  NARRATIVE: [name] was brought to the CT Simulation planning suite.  Identity was confirmed.  All relevant records and images related to the planned course of therapy were reviewed.  Then, the patient was positioned in a stable reproducible clinical set-up for radiation therapy.  Wires were placed to delineate the clinical extent of breast tissue. A wire was placed on the scar as well.  CT images were obtained.  An isocenter was placed. Skin markings were placed.  The position of the heart was then analyzed.  Due to the proximity of the heart to the chest wall, I felt she would benefit from deep inspiration breath hold for cardiac sparing.  She was then coached and rescanned in the breath hold position.  Acceptable cardiac sparing was achieved. The CT images were loaded into the planning software where the target and avoidance structures were contoured.  The radiation prescription was entered and confirmed. The patient was discharged in stable condition and tolerated simulation well.    TREATMENT PLANNING NOTE/3D Simulation Note Treatment planning then occurred. I have requested : MLC's, isodose plan, basic dose calculation  3D simulation was performed.  I personally designed and supervised the construction of 3 medically necessary complex treatment devices in the form of MLCs which will be used for beam modification and to protect critical structures including the heart and lung as well as the immobilization device which is necessary for reproducible set up.  I have requested a dose volume histogram of the heart, lung and tumor cavity.    RESPIRATORY MOTION MANAGEMENT SIMULATION - Deep Inspiration Breath Hold  NARRATIVE:   In order to account for effect of respiratory motion on target structures and other organs in the planning and delivery of radiotherapy, this patient underwent respiratory motion management simulation.  To accomplish this, when the patient was brought to the CT simulation planning suite, a bellows was placed on the her abdomen.  Wave forms of the patient's breathing were obtained. Coaching was performed and practice sessions initiated to monitor her ability to obtain and maintain deep inspiration breath hold.  The CT images were loaded into the planning software and fused with her free breathing images by physics.  Acceptable cardiac sparing was achieved through the use of deep inspiration breath hold.  Planning will be performed on her breath hold scan

## 2013-02-08 NOTE — Progress Notes (Signed)
Please see the Nurse Progress Note in the MD Initial Consult Encounter for this patient. 

## 2013-02-08 NOTE — Progress Notes (Addendum)
Breast clinic 12/19/12, left breast Saw Dr. Ezzard Standing 01/29/13 aspirated 20cc left breast seroma, no drainage now, f/u with him 02/14/13 Left breast mild  Erythema on breast,nipple invertred with incision, taking Keflex till tomorrow , soreness  Tenderness, a lot better since fluid was removed, concerned of breast being inverted, self-image concerns 10:53 AM

## 2013-02-08 NOTE — Progress Notes (Signed)
   Department of Radiation Oncology  Phone:  [phone] Fax:        [phone]   Name: [name] MRN: [id number]  DOB: 05/21/1956  Date: 02/08/2013  Follow Up Visit Note  Diagnosis: T1cN0(i+) left breast cancer  Interval History: [name] presents today for routine followup.  She underwent lumpectomy and sentinel lymph node biopsy on 12/31/2012. This showed a 1.5 cm invasive ductal carcinoma which was grade 2 with associated DCIS. Her margins were negative. Lymphovascular invasion was identified. A single sentinel lymph node was removed with isolated tumor cells. Her tumor was ER/PR positive HER-2 negative. Closest margin was 0.8 cm anteriorly. She had an Oncotype score of 13 and therefore she and medical oncology decided she would not benefit from chemotherapy. She unfortunately had a postoperative wound infection which required drainage of her seroma. This has left her with an inverted nipple and some fluid in the. Area over her region. She is not pleased with her cosmetic result and is interested in referral to plastic surgery for possibly a symmetry procedure on the right or intervention on the left. She has no pain at this time. She is ready to proceed forward with radiation. She has decided to receive her treatment here in [place].  Allergies: No Known Allergies  Medications:  Current Outpatient Prescriptions  Medication Sig Dispense Refill  . aspirin 325 MG tablet Take 325 mg by mouth daily.      Marland Kitchen atorvastatin (LIPITOR) 10 MG tablet Take 10 mg by mouth daily.      . Calcium Carbonate-Vitamin D (CALCIUM + D PO) Take by mouth daily.      . cephALEXin (KEFLEX) 500 MG capsule Take 1 capsule (500 mg total) by mouth 4 (four) times daily.  40 capsule  0  . citalopram (CELEXA) 10 MG tablet Take 10 mg by mouth daily.      . Fluticasone Propionate (FLONASE NA) Place into the nose.      . Loratadine (CLARITIN) 10 MG CAPS Take 10 mg by mouth as needed.      . Multiple Vitamins-Minerals  (CENTRUM SILVER ADULT 50+ PO) Take by mouth.       No current facility-administered medications for this encounter.    Physical Exam:  Filed Vitals:   02/08/13 1036  BP: 127/80  Pulse: 60  Temp: 97.7 F (36.5 C)  Resp: 20   she is a pleasant female in no distress sitting comfortably examining table. She does have an inverted nipple with edema around the area left. Her scar is healed up well.  IMPRESSION: [name] is a 57 y.o. female status post lumpectomy of the left breast revealing a stage I tumor with isolated tumor cells in a single lymph node  PLAN:  The patient regarding the indications for radiation after lumpectomy. We discussed the process of simulation the placement tattoos. We discussed 4-6 weeks of treatment as an outpatient. We discussed the possible side effects of treatment including but not limited to skin redness and fatigue. We discussed the use of breath hold technique for cardiac sparing. We discussed the low likelihood of symptomatic lung or rib damage. She has signed informed consent and agree to proceed forward. We were able to work her in for simulation today.    Lurline Hare, MD

## 2013-02-13 NOTE — Addendum Note (Signed)
Encounter addended by: Lowella Petties, RN on: 02/13/2013  7:54 AM<BR>     Documentation filed: Charges VN

## 2013-02-14 ENCOUNTER — Ambulatory Visit (INDEPENDENT_AMBULATORY_CARE_PROVIDER_SITE_OTHER): Payer: Commercial Indemnity | Admitting: Surgery

## 2013-02-14 ENCOUNTER — Encounter (INDEPENDENT_AMBULATORY_CARE_PROVIDER_SITE_OTHER): Payer: Self-pay | Admitting: Surgery

## 2013-02-14 VITALS — BP 130/88 | HR 64 | Temp 98.7°F | Resp 14 | Ht 64.5 in | Wt 173.8 lb

## 2013-02-14 DIAGNOSIS — C50419 Malignant neoplasm of upper-outer quadrant of unspecified female breast: Secondary | ICD-10-CM

## 2013-02-14 DIAGNOSIS — C50412 Malignant neoplasm of upper-outer quadrant of left female breast: Secondary | ICD-10-CM

## 2013-02-14 NOTE — Progress Notes (Addendum)
Re:   [name] DOB:   06-21-1956 MRN:   [id number]  [facility]  ASSESSMENT AND PLAN: 1.  Left breast cancer - at 1 o'clock  Path - 1.5 IDC, Grade 2, ER 100%, PR 100%, Her2Neu - neg., Ki67 - 12%. 0/1 nodes (but node had isolated tumor cells)  Oncology - Magrinat and Michell Heinrich.  Left breast lumpectomy and left axillary SLNBx - 12/31/2012  Oncotype - 13, recurrence risk 8%.  To start radiation tx next week.  She'll see me in 6 months.  1a. Left breast wound drainage.  This has resolved.  She now has her left nipple pulled up - which she is not happy about.  I would give it at least 6 months after radiation tx before doing anything else.  2.  History of int carotid stenosis (secondary to fibromuscular dysplasia) - she underwent carotid artery dilatation in 2003.  Drs. Dohmeir and Tour manager on chronic aspirin for this 3.  On chronic aspirin for #2.  She is going to check with Dr. Vickey Huger about stopping the aspirin in anticipation of surgery 4.  Hypercholesterolemia   REFERRING PHYSICIAN: [name], MD  HISTORY OF PRESENT ILLNESS: [name] is a 57 y.o. (DOB: 11/27/1955) (DOB: 11/27/1955)  white  female whose primary care physician is Brianna Mountain, MD and comes for follow up of a left breast lumpecotmy.  She comes by herself today.  She is doing better.  She does not like the way the left nipple is pulled up.    History of Breast Cancer: The patient went for her routine mammogram.  Her last mammogram was about 15 months ago.  The mammogram at [facility] showed a 1.5 x 1.3 cm mass at the 1 o'clock position.  A core biopsy showed an Invasive Ductal Carcinoma.  A breast MRI on 12/18/2012 showed a 2.0 x 1.4 cm mass in the UOQ of the left breast. She has a cousin who had breast cancer, but no first degree relative.  She went 9 months without a period, then recently had a small period.  She has seen Dr. Rosealee Albee for this.  He is checking some blood work to see if she has gone through  menopause.  She is not on hormone meds.   Past Medical History  Diagnosis Date  . PONV (postoperative nausea and vomiting)   . Carotid artery stenosis     hx-dr dohmier monitors  . Wears glasses   . Seasonal allergies   . Breast cancer 12/12/12 bx    left breast     Current Outpatient Prescriptions  Medication Sig Dispense Refill  . aspirin 325 MG tablet Take 325 mg by mouth daily.      Marland Kitchen atorvastatin (LIPITOR) 10 MG tablet Take 10 mg by mouth daily.      . Calcium Carbonate-Vitamin D (CALCIUM + D PO) Take by mouth daily.      . citalopram (CELEXA) 10 MG tablet Take 10 mg by mouth daily.      . Fluticasone Propionate (FLONASE NA) Place into the nose.      . Loratadine (CLARITIN) 10 MG CAPS Take 10 mg by mouth as needed.      . Multiple Vitamins-Minerals (CENTRUM SILVER ADULT 50+ PO) Take by mouth.       No current facility-administered medications for this visit.     No Known Allergies  REVIEW OF SYSTEMS: Neurologic:  Prior carotid artery dilatation around 2002.  Followed by Dr. Vickey Huger. Endocrine:  No diabetes.  No thyroid disease.  Hypercholesterolemia. Gastrointestinal:  No history of stomach disease.  No history of liver disease.  No history of gall bladder disease.  No history of pancreas disease.  No history of colon disease.  Some rectal bleeding attributed to hemorrhoids.  Negative colonoscopy at age 31 - Dr. Josefa Half. Urologic:  No history of kidney stones.  No history of bladder infections. Musculoskeletal:  Back surgery, Dr. Dutch Quint, 11/2000 Hematologic:  No bleeding disorder. On aspirin.  SOCIAL and FAMILY HISTORY: Married. Husband and daughter, [name], with patient. Husband had lymphoma treated by Dr. Gaylyn Rong.  Someone in my group did the original abdominal biopsy.  PHYSICAL EXAM: BP 130/88  Pulse 64  Temp(Src) 98.7 F (37.1 C) (Temporal)  Resp 14  Ht 5' 4.5" (1.638 m)  Wt 173 lb 12.8 oz (78.835 kg)  BMI 29.38 kg/m2  General: WN WF who is alert and generally  healthy appearing.  Neck: Supple. No mass.  No thyroid mass. Breasts:  Right - unremarkable  Left -  Area above left nipple is firm - but this is normal post op.  She does have her left nipple pulled up.  I would hope that this will get better as she gets further out from surgery.  DATA REVIEWED: Epic notes.  Ovidio Kin, MD,  [facility] [address] [place]  [address]   [place], [place]    [address] Phone:  [phone] FAX:  [phone]

## 2013-02-15 ENCOUNTER — Ambulatory Visit
Admission: RE | Admit: 2013-02-15 | Discharge: 2013-02-15 | Disposition: A | Discharge status: Home / Self Care | Payer: 59 | Source: Ambulatory Visit | Attending: Radiation Oncology | Admitting: Radiation Oncology

## 2013-02-15 DIAGNOSIS — C50412 Malignant neoplasm of upper-outer quadrant of left female breast: Secondary | ICD-10-CM

## 2013-02-15 NOTE — Progress Notes (Signed)
  Radiation Oncology         [phone] ________________________________  Name: [name] MRN: [id number]  Date: 02/15/2013  DOB: 1956-02-01  Simulation Verification Note  Status: outpatient  NARRATIVE: The patient was brought to the treatment unit and placed in the planned treatment position. The clinical setup was verified. Then port films were obtained and uploaded to the radiation oncology medical record software.  The treatment beams were carefully compared against the planned radiation fields. The position location and shape of the radiation fields was reviewed. The targeted volume of tissue appears appropriately covered by the radiation beams. Organs at risk appear to be excluded as planned.  Based on my personal review, I approved the simulation verification. The patient's treatment will proceed as planned.  ------------------------------------------------  Lurline Hare, MD

## 2013-02-18 ENCOUNTER — Ambulatory Visit
Admission: RE | Admit: 2013-02-18 | Discharge: 2013-02-18 | Disposition: A | Discharge status: Home / Self Care | Payer: 59 | Source: Ambulatory Visit | Attending: Radiation Oncology | Admitting: Radiation Oncology

## 2013-02-19 ENCOUNTER — Encounter: Payer: Self-pay | Admitting: Radiation Oncology

## 2013-02-19 ENCOUNTER — Ambulatory Visit
Admission: RE | Admit: 2013-02-19 | Discharge: 2013-02-19 | Disposition: A | Discharge status: Home / Self Care | Payer: 59 | Source: Ambulatory Visit | Attending: Radiation Oncology | Admitting: Radiation Oncology

## 2013-02-19 VITALS — BP 121/81 | HR 64 | Temp 98.2°F | Resp 16 | Wt 177.1 lb

## 2013-02-19 DIAGNOSIS — C50412 Malignant neoplasm of upper-outer quadrant of left female breast: Secondary | ICD-10-CM

## 2013-02-19 MED ORDER — ALRA NON-METALLIC DEODORANT (RAD-ONC)
1.0000 "application " | Freq: Once | TOPICAL | Status: AC
  Administered 2013-02-19: 1 via TOPICAL

## 2013-02-19 MED ORDER — RADIAPLEXRX EX GEL
Freq: Once | CUTANEOUS | Status: AC
  Administered 2013-02-19: 14:00:00 via TOPICAL

## 2013-02-19 NOTE — Progress Notes (Signed)
Weekly Management Note Current Dose: 3.6 Gy  Projected Dose: 45 Gy   Narrative:  The patient presents for routine under treatment assessment.  CBCT/MVCT images/Port film x-rays were reviewed.  The chart was checked. Doing well. No complaints.   Physical Findings: Weight: 177 lb 1.6 oz (80.332 kg). Unchanged  Impression:  The patient is tolerating radiation.  Plan:  Continue treatment as planned. RN education performed.

## 2013-02-19 NOTE — Progress Notes (Signed)
Weekly rad txs, 2/33  Lt breast slight pink,  No skin breakdown, indented breast area, patient education, done, radiation therapy and you book, alra, radiaplex gel,  And discusses ss/s, s/e ,fatigue,skin irriitation, pain, teach back given by patient no c/o pain 2:13 PM

## 2013-02-20 ENCOUNTER — Ambulatory Visit
Admission: RE | Admit: 2013-02-20 | Discharge: 2013-02-20 | Disposition: A | Discharge status: Home / Self Care | Payer: 59 | Source: Ambulatory Visit | Attending: Radiation Oncology | Admitting: Radiation Oncology

## 2013-02-21 ENCOUNTER — Ambulatory Visit
Admission: RE | Admit: 2013-02-21 | Discharge: 2013-02-21 | Disposition: A | Discharge status: Home / Self Care | Payer: 59 | Source: Ambulatory Visit | Attending: Radiation Oncology | Admitting: Radiation Oncology

## 2013-02-22 ENCOUNTER — Ambulatory Visit
Admission: RE | Admit: 2013-02-22 | Discharge: 2013-02-22 | Disposition: A | Discharge status: Home / Self Care | Payer: 59 | Source: Ambulatory Visit | Attending: Radiation Oncology | Admitting: Radiation Oncology

## 2013-02-25 ENCOUNTER — Ambulatory Visit
Admission: RE | Admit: 2013-02-25 | Discharge: 2013-02-25 | Disposition: A | Discharge status: Home / Self Care | Payer: 59 | Source: Ambulatory Visit | Attending: Radiation Oncology | Admitting: Radiation Oncology

## 2013-02-26 ENCOUNTER — Ambulatory Visit
Admission: RE | Admit: 2013-02-26 | Discharge: 2013-02-26 | Disposition: A | Discharge status: Home / Self Care | Payer: 59 | Source: Ambulatory Visit | Attending: Radiation Oncology | Admitting: Radiation Oncology

## 2013-02-26 ENCOUNTER — Encounter: Payer: Self-pay | Admitting: Radiation Oncology

## 2013-02-26 VITALS — BP 149/98 | HR 67 | Temp 97.9°F | Resp 20 | Wt 175.5 lb

## 2013-02-26 DIAGNOSIS — C50912 Malignant neoplasm of unspecified site of left female breast: Secondary | ICD-10-CM

## 2013-02-26 NOTE — Progress Notes (Addendum)
Weekly Management Note Current Dose: 12.6  Gy  Projected Dose: 45 Gy   Narrative:  The patient presents for routine under treatment assessment.  CBCT/MVCT images/Port film x-rays were reviewed.  The chart was checked. Doing well. Using radiaplex. Some irritation under axilla but really doing well. Hot flashes have returned and are bothersome.  She is on celexa for this.   Physical Findings: Weight: 175 lb 8 oz (79.606 kg). Unchanged  Impression:  The patient is tolerating radiation.  Plan:  Continue treatment as planned. Continue radiaplex.

## 2013-02-26 NOTE — Progress Notes (Signed)
Weekly rad txs, 7/25 lt breast, erythema slight ,skin intact, using radiaplex gel bid, undwer left axilla erythema ,"hurts some"hasn't used the radiaplex gel there, will start,  no c/o pain, only taking 1/2 tab of celexa, having more hot flashes and night sweats, changing her sheets at night, eating well,  Not otherwise examined today.

## 2013-02-27 ENCOUNTER — Ambulatory Visit
Admission: RE | Admit: 2013-02-27 | Discharge: 2013-02-27 | Disposition: A | Discharge status: Home / Self Care | Payer: 59 | Source: Ambulatory Visit | Attending: Radiation Oncology | Admitting: Radiation Oncology

## 2013-02-28 ENCOUNTER — Ambulatory Visit
Admission: RE | Admit: 2013-02-28 | Discharge: 2013-02-28 | Disposition: A | Discharge status: Home / Self Care | Payer: 59 | Source: Ambulatory Visit | Attending: Radiation Oncology | Admitting: Radiation Oncology

## 2013-03-01 ENCOUNTER — Ambulatory Visit
Admission: RE | Admit: 2013-03-01 | Discharge: 2013-03-01 | Disposition: A | Discharge status: Home / Self Care | Payer: 59 | Source: Ambulatory Visit | Attending: Radiation Oncology | Admitting: Radiation Oncology

## 2013-03-05 ENCOUNTER — Ambulatory Visit
Admission: RE | Admit: 2013-03-05 | Discharge: 2013-03-05 | Disposition: A | Discharge status: Home / Self Care | Payer: 59 | Source: Ambulatory Visit | Attending: Radiation Oncology | Admitting: Radiation Oncology

## 2013-03-05 ENCOUNTER — Encounter: Payer: Self-pay | Admitting: Radiation Oncology

## 2013-03-05 VITALS — BP 155/85 | HR 70 | Temp 98.2°F | Resp 20 | Wt 175.5 lb

## 2013-03-05 DIAGNOSIS — C50412 Malignant neoplasm of upper-outer quadrant of left female breast: Secondary | ICD-10-CM

## 2013-03-05 NOTE — Progress Notes (Signed)
Weekly rad txs, left breast 11/ completed so far, slight erythema, start of dermatitis mid chest,  using radiaplex gel bid, on celexa  Full strength for hot flashes, is helping states patient 10:58 AM

## 2013-03-05 NOTE — Progress Notes (Signed)
Weekly Management Note Current Dose: 19.8 Gy  Projected Dose: 61 Gy   Narrative:  The patient presents for routine under treatment assessment.  CBCT/MVCT images/Port film x-rays were reviewed.  The chart was checked. Hot flashes improved on Celexa  Physical Findings: Weight: 175 lb 8 oz (79.606 kg). Small area of dermatitis medially.   Impression:  The patient is tolerating radiation.  Plan:  Continue treatment as planned. Add hydrocortisone prn.

## 2013-03-06 ENCOUNTER — Ambulatory Visit
Admission: RE | Admit: 2013-03-06 | Discharge: 2013-03-06 | Disposition: A | Discharge status: Home / Self Care | Payer: 59 | Source: Ambulatory Visit | Attending: Radiation Oncology | Admitting: Radiation Oncology

## 2013-03-07 ENCOUNTER — Ambulatory Visit
Admission: RE | Admit: 2013-03-07 | Discharge: 2013-03-07 | Disposition: A | Discharge status: Home / Self Care | Payer: 59 | Source: Ambulatory Visit | Attending: Radiation Oncology | Admitting: Radiation Oncology

## 2013-03-08 ENCOUNTER — Ambulatory Visit
Admission: RE | Admit: 2013-03-08 | Discharge: 2013-03-08 | Disposition: A | Discharge status: Home / Self Care | Payer: 59 | Source: Ambulatory Visit | Attending: Radiation Oncology | Admitting: Radiation Oncology

## 2013-03-11 ENCOUNTER — Ambulatory Visit
Admission: RE | Admit: 2013-03-11 | Discharge: 2013-03-11 | Disposition: A | Discharge status: Home / Self Care | Payer: 59 | Source: Ambulatory Visit | Attending: Radiation Oncology | Admitting: Radiation Oncology

## 2013-03-12 ENCOUNTER — Ambulatory Visit
Admission: RE | Admit: 2013-03-12 | Discharge: 2013-03-12 | Disposition: A | Discharge status: Home / Self Care | Payer: 59 | Source: Ambulatory Visit | Attending: Radiation Oncology | Admitting: Radiation Oncology

## 2013-03-12 VITALS — BP 134/79 | HR 66 | Temp 98.1°F | Wt 175.6 lb

## 2013-03-12 DIAGNOSIS — C50412 Malignant neoplasm of upper-outer quadrant of left female breast: Secondary | ICD-10-CM

## 2013-03-12 NOTE — Progress Notes (Signed)
Weekly Management Note Current Dose:  28.8 Gy  Projected Dose: 61 Gy   Narrative:  The patient presents for routine under treatment assessment.  CBCT/MVCT images/Port film x-rays were reviewed.  The chart was checked. Doing well. No complaints. Mild fatigue and hot flashes.   Physical Findings: Weight: 175 lb 9.6 oz (79.652 kg). Mildly pink skin. Inframammary fold is clear.   Impression:  The patient is tolerating radiation.  Plan:  Continue treatment as planned. Continue radiaplex.

## 2013-03-12 NOTE — Progress Notes (Signed)
Patient here for routine weekly assessment of radiation to left breast.Completed 16 of 25 treatments.Skin is pink in mid-chest. No peeling.Continue with radiaplex twice daily.Mild fatigue relieved with sleep.Continued hot flashes.

## 2013-03-13 ENCOUNTER — Ambulatory Visit
Admission: RE | Admit: 2013-03-13 | Discharge: 2013-03-13 | Disposition: A | Discharge status: Home / Self Care | Payer: 59 | Source: Ambulatory Visit | Attending: Radiation Oncology | Admitting: Radiation Oncology

## 2013-03-14 ENCOUNTER — Ambulatory Visit
Admission: RE | Admit: 2013-03-14 | Discharge: 2013-03-14 | Disposition: A | Discharge status: Home / Self Care | Payer: 59 | Source: Ambulatory Visit | Attending: Radiation Oncology | Admitting: Radiation Oncology

## 2013-03-15 ENCOUNTER — Ambulatory Visit
Admission: RE | Admit: 2013-03-15 | Discharge: 2013-03-15 | Disposition: A | Discharge status: Home / Self Care | Payer: 59 | Source: Ambulatory Visit | Attending: Radiation Oncology | Admitting: Radiation Oncology

## 2013-03-18 ENCOUNTER — Ambulatory Visit
Admission: RE | Admit: 2013-03-18 | Discharge: 2013-03-18 | Disposition: A | Discharge status: Home / Self Care | Payer: 59 | Source: Ambulatory Visit | Attending: Radiation Oncology | Admitting: Radiation Oncology

## 2013-03-19 ENCOUNTER — Ambulatory Visit
Admission: RE | Admit: 2013-03-19 | Discharge: 2013-03-19 | Disposition: A | Discharge status: Home / Self Care | Payer: 59 | Source: Ambulatory Visit | Attending: Radiation Oncology | Admitting: Radiation Oncology

## 2013-03-19 VITALS — BP 134/82 | HR 74 | Temp 98.0°F | Wt 176.1 lb

## 2013-03-19 DIAGNOSIS — C50412 Malignant neoplasm of upper-outer quadrant of left female breast: Secondary | ICD-10-CM

## 2013-03-19 NOTE — Progress Notes (Signed)
Weekly Management Note Current Dose: 37.8  Gy  Projected Dose: 61 Gy   Narrative:  The patient presents for routine under treatment assessment.  CBCT/MVCT images/Port film x-rays were reviewed.  The chart was checked. Doing well. Some fatigue.   Physical Findings: Weight: 176 lb 1.6 oz (79.878 kg). Slightly pink left breast.   Impression:  The patient is tolerating radiation.  Plan:  Continue treatment as planned. Continue radiaplex. Discussed boost.

## 2013-03-19 NOTE — Progress Notes (Signed)
Patient for weekly assessment of left breast breast radiation.Completed 21 of 25 treatments.Skin is mildly red without peeling.Mild discomfort of left axilla and has noticed increase in fatigue resolved with rest.Continue with application of radiaplex.

## 2013-03-20 ENCOUNTER — Ambulatory Visit
Admission: RE | Admit: 2013-03-20 | Discharge: 2013-03-20 | Disposition: A | Discharge status: Home / Self Care | Payer: 59 | Source: Ambulatory Visit | Attending: Radiation Oncology | Admitting: Radiation Oncology

## 2013-03-21 ENCOUNTER — Encounter: Payer: Self-pay | Admitting: Radiation Oncology

## 2013-03-21 ENCOUNTER — Ambulatory Visit
Admission: RE | Admit: 2013-03-21 | Discharge: 2013-03-21 | Disposition: A | Discharge status: Home / Self Care | Payer: 59 | Source: Ambulatory Visit | Attending: Radiation Oncology | Admitting: Radiation Oncology

## 2013-03-21 NOTE — Progress Notes (Signed)
Name: [name]   MRN: [id number]  Date:  03/21/2013   DOB: 12-21-1955  Status:outpatient    DIAGNOSIS: Breast cancer.  CONSENT VERIFIED: yes   SET UP: Patient is setup supine   IMMOBILIZATION:  The following immobilization was used:Custom Moldable Pillow, breast board.   NARRATIVE: [name] underwent complex simulation and treatment planning for her boost treatment today.  Her tumor volume was outlined on the planning CT scan. The depth of her cavity was 4.88 Cm.     15  MeV electrons will be prescribed to the 97% Isodose line.   A block will be used for beam modification purposes.  A special port plan is requested.

## 2013-03-22 ENCOUNTER — Ambulatory Visit
Admission: RE | Admit: 2013-03-22 | Discharge: 2013-03-22 | Disposition: A | Discharge status: Home / Self Care | Payer: 59 | Source: Ambulatory Visit | Attending: Radiation Oncology | Admitting: Radiation Oncology

## 2013-03-25 ENCOUNTER — Ambulatory Visit
Admission: RE | Admit: 2013-03-25 | Discharge: 2013-03-25 | Disposition: A | Discharge status: Home / Self Care | Payer: 59 | Source: Ambulatory Visit | Attending: Radiation Oncology | Admitting: Radiation Oncology

## 2013-03-26 ENCOUNTER — Ambulatory Visit
Admission: RE | Admit: 2013-03-26 | Discharge: 2013-03-26 | Disposition: A | Discharge status: Home / Self Care | Payer: 59 | Source: Ambulatory Visit | Attending: Radiation Oncology | Admitting: Radiation Oncology

## 2013-03-26 ENCOUNTER — Encounter: Payer: Self-pay | Admitting: Radiation Oncology

## 2013-03-26 DIAGNOSIS — C50912 Malignant neoplasm of unspecified site of left female breast: Secondary | ICD-10-CM

## 2013-03-26 MED ORDER — RADIAPLEXRX EX GEL
Freq: Once | CUTANEOUS | Status: AC
  Administered 2013-03-26: 11:00:00 via TOPICAL

## 2013-03-26 NOTE — Progress Notes (Signed)
Seen at treatment machine by Dr.Wentworth per [facility] RT.

## 2013-03-26 NOTE — Progress Notes (Signed)
Called [name] back and gave her the phone number for [facility] provided to me by Baltazar Apo - phone number [phone].  Told patient if she needed further assistance, to please let us know.

## 2013-03-26 NOTE — Progress Notes (Signed)
Weekly Management Note Current Dose: 47  Gy  Projected Dose: 61 Gy   Narrative:  The patient presents for routine under treatment assessment.  CBCT/MVCT images/Port film x-rays were reviewed.  The chart was checked. Doing well. Saw on treatment machine for set up. Skin irritated. Needs more radiaplex.  Physical Findings: Red skin (more medially). Upturned nipple.  Impression:  The patient is tolerating radiation.  Plan:  Continue treatment as planned. Discussed follow up in 1 month.

## 2013-03-26 NOTE — Progress Notes (Signed)
Patient stopped by to ask about bill rec'd for Oncotype testing she had done back the end of June 2014.  Told her I would try to find out the person handling the pre-cert and have them call her.  Have emailed Thelma Barge and Baltazar Apo.

## 2013-03-27 ENCOUNTER — Ambulatory Visit
Admission: RE | Admit: 2013-03-27 | Discharge: 2013-03-27 | Disposition: A | Discharge status: Home / Self Care | Payer: 59 | Source: Ambulatory Visit | Attending: Radiation Oncology | Admitting: Radiation Oncology

## 2013-03-28 ENCOUNTER — Ambulatory Visit
Admission: RE | Admit: 2013-03-28 | Discharge: 2013-03-28 | Disposition: A | Discharge status: Home / Self Care | Payer: 59 | Source: Ambulatory Visit | Attending: Radiation Oncology | Admitting: Radiation Oncology

## 2013-03-29 ENCOUNTER — Ambulatory Visit
Admission: RE | Admit: 2013-03-29 | Discharge: 2013-03-29 | Disposition: A | Discharge status: Home / Self Care | Payer: 59 | Source: Ambulatory Visit | Attending: Radiation Oncology | Admitting: Radiation Oncology

## 2013-04-01 ENCOUNTER — Ambulatory Visit
Admission: RE | Admit: 2013-04-01 | Discharge: 2013-04-01 | Disposition: A | Discharge status: Home / Self Care | Payer: 59 | Source: Ambulatory Visit | Attending: Radiation Oncology | Admitting: Radiation Oncology

## 2013-04-02 ENCOUNTER — Ambulatory Visit
Admission: RE | Admit: 2013-04-02 | Discharge: 2013-04-02 | Disposition: A | Discharge status: Home / Self Care | Payer: 59 | Source: Ambulatory Visit | Attending: Radiation Oncology | Admitting: Radiation Oncology

## 2013-04-02 VITALS — BP 128/80 | HR 66 | Temp 97.8°F | Wt 174.6 lb

## 2013-04-02 DIAGNOSIS — C50412 Malignant neoplasm of upper-outer quadrant of left female breast: Secondary | ICD-10-CM

## 2013-04-02 NOTE — Progress Notes (Signed)
[facility]    Radiation Oncology [address] [place]     Maryln Gottron, M.D. [place], [place] [address]               Billie Lade, M.D., Ph.D. Phone: [phone]      Molli Hazard A. Kathrynn Running, M.D. Fax: [phone]      Radene Gunning, M.D., Ph.D.         Lurline Hare, M.D.         Grayland Jack, M.D Weekly Treatment Management Note  Name: [name]     MRN: [id number]        CSN: [id number] Date: 04/02/2013      DOB: Jul 06, 1955  CC: Brianna Mountain, MD         [name]    Status: Outpatient  Diagnosis: The encounter diagnosis was Cancer of upper-outer quadrant of female breast, left.  Current Dose: 57 Gy  Current Fraction: 31  Planned Dose: 61 Gy  Narrative: [name] was seen today for weekly treatment management. The chart was checked and port films  were reviewed. She continues to tolerate her treatments well. She's had some mild fatigue as above some mild itching and discomfort in the breast area.  Review of patient's allergies indicates no known allergies.  Current Outpatient Prescriptions  Medication Sig Dispense Refill  . aspirin 325 MG tablet Take 325 mg by mouth daily.      Marland Kitchen atorvastatin (LIPITOR) 10 MG tablet Take 10 mg by mouth daily.      . Calcium Carbonate-Vitamin D (CALCIUM + D PO) Take by mouth daily.      . citalopram (CELEXA) 10 MG tablet Take 10 mg by mouth daily.      . Fluticasone Propionate (FLONASE NA) Place into the nose.      . hyaluronate sodium (RADIAPLEXRX) GEL Apply 1 application topically 2 (two) times daily. Apply after rad txs and bedtime daily      . Loratadine (CLARITIN) 10 MG CAPS Take 10 mg by mouth as needed.      . Multiple Vitamins-Minerals (CENTRUM SILVER ADULT 50+ PO) Take by mouth.      . non-metallic deodorant Thornton Papas) MISC Apply 1 application topically daily as needed. Apply after rad txs daily       No current facility-administered medications for this encounter.   Labs:  Lab Results  Component  Value Date   WBC 5.9 12/19/2012   HGB 14.6 12/19/2012   HCT 43.0 12/19/2012   MCV 88.6 12/19/2012   PLT 208 12/19/2012   Lab Results  Component Value Date   CREATININE 0.8 12/19/2012   BUN 15.2 12/19/2012   NA 141 12/19/2012   K 3.9 12/19/2012   CL 107 12/19/2012   CO2 24 12/19/2012   Lab Results  Component Value Date   ALT 25 12/19/2012   AST 18 12/19/2012   BILITOT 0.61 12/19/2012    Physical Examination:  weight is 174 lb 9.6 oz (79.198 kg). Her temperature is 97.8 F (36.6 C). Her blood pressure is 128/80 and her pulse is 66.    Wt Readings from Last 3 Encounters:  04/02/13 174 lb 9.6 oz (79.198 kg)  03/19/13 176 lb 1.6 oz (79.878 kg)  03/12/13 175 lb 9.6 oz (79.652 kg)    The left breast area shows follicular reaction, erythema without any moist desquamation. Lungs - Normal respiratory effort, chest expands symmetrically. Lungs are clear to auscultation, no crackles or wheezes.  Heart has regular rhythm and  rate  Abdomen is soft and non tender with normal bowel sounds  Assessment:  Patient tolerating treatments well  Plan: Continue treatment per original radiation prescription

## 2013-04-02 NOTE — Progress Notes (Signed)
Routine weekly assessment of radiation to left breast.Completed 31 of 33 treatments..Mild redness with slight follicular reaction under mammary fold.Applying radiaplex twice daily.Has tickle in throat, will start taking Claritin for allergies.Mild fatigue.

## 2013-04-03 ENCOUNTER — Ambulatory Visit
Admission: RE | Admit: 2013-04-03 | Discharge: 2013-04-03 | Disposition: A | Discharge status: Home / Self Care | Payer: 59 | Source: Ambulatory Visit | Attending: Radiation Oncology | Admitting: Radiation Oncology

## 2013-04-04 ENCOUNTER — Ambulatory Visit
Admission: RE | Admit: 2013-04-04 | Discharge: 2013-04-04 | Disposition: A | Discharge status: Home / Self Care | Payer: 59 | Source: Ambulatory Visit | Attending: Radiation Oncology | Admitting: Radiation Oncology

## 2013-04-04 ENCOUNTER — Encounter: Payer: Self-pay | Admitting: Radiation Oncology

## 2013-04-07 NOTE — Progress Notes (Signed)
  Radiation Oncology         [phone] ________________________________  Name: [name] MRN: [id number]  Date: 04/04/2013  DOB: 31-Oct-1955  End of Treatment Note  Diagnosis:   T1cN0(i+)MX   Indication for treatment:  curative       Radiation treatment dates:   02/18/13-04/04/2013  Site/dose:   Left breast/ 45 Gy            Left breast boost / 16 Gy  Beams/energy:   Opposed tangents/ and photons         En face electons/ 15 MeV electrons  Narrative: The patient tolerated radiation treatment relatively well.   She had the expected skin irritation.  Plan: The patient has completed radiation treatment. The patient will return to radiation oncology clinic for routine followup in one month. I advised them to call or return sooner if they have any questions or concerns related to their recovery or treatment.  ------------------------------------------------  Lurline Hare, MD

## 2013-04-30 ENCOUNTER — Telehealth: Payer: Self-pay | Admitting: Oncology

## 2013-04-30 ENCOUNTER — Other Ambulatory Visit (HOSPITAL_BASED_OUTPATIENT_CLINIC_OR_DEPARTMENT_OTHER): Payer: Commercial Indemnity | Admitting: Lab

## 2013-04-30 ENCOUNTER — Ambulatory Visit (HOSPITAL_BASED_OUTPATIENT_CLINIC_OR_DEPARTMENT_OTHER): Payer: Commercial Indemnity | Admitting: Oncology

## 2013-04-30 VITALS — BP 149/84 | HR 62 | Temp 98.5°F | Resp 18 | Ht 64.5 in | Wt 173.9 lb

## 2013-04-30 DIAGNOSIS — C50412 Malignant neoplasm of upper-outer quadrant of left female breast: Secondary | ICD-10-CM | POA: Insufficient documentation

## 2013-04-30 DIAGNOSIS — C50419 Malignant neoplasm of upper-outer quadrant of unspecified female breast: Secondary | ICD-10-CM

## 2013-04-30 DIAGNOSIS — Z170 Estrogen receptor positive status [ER+]: Secondary | ICD-10-CM | POA: Insufficient documentation

## 2013-04-30 LAB — CBC WITH DIFFERENTIAL/PLATELET
Basophils Absolute: 0.1 10*3/uL (ref 0.0–0.1)
EOS%: 1.1 % (ref 0.0–7.0)
Eosinophils Absolute: 0.1 10*3/uL (ref 0.0–0.5)
HCT: 40.5 % (ref 34.8–46.6)
HGB: 13.5 g/dL (ref 11.6–15.9)
MCH: 29.7 pg (ref 25.1–34.0)
MONO#: 0.4 10*3/uL (ref 0.1–0.9)
NEUT#: 3.3 10*3/uL (ref 1.5–6.5)
NEUT%: 67.3 % (ref 38.4–76.8)
lymph#: 1 10*3/uL (ref 0.9–3.3)

## 2013-04-30 NOTE — Progress Notes (Signed)
ID: [name] OB: 05/23/1956  MR#: [id number]  CSN#:[id number]  PCP: [name], MD GYN:  Annamaria Helling SU: Ovidio Kin OTHER MD: Lurline Hare, Karene Fry, Porfirio Mylar Dohmeier   HISTORY OF PRESENT ILLNESS: [name] had routine mammography/tomography 11/28/2012 suggesting and abnormality in the left breast. Diagnostic left mammography and ultrasonography at the breast Center 12/07/2012 confirmed a density in the upper outer quadrant of the left breast, which was not palpable by exam. Ultrasound showed an irregular hypoechoic lesion in the left breast measuring 1.5 cm. The left axilla was sonographically benign.  Biopsy of the left breast mass in question 12/12/2012 showed an invasive ductal carcinoma, grade 2, estrogen and progesterone receptor positive, HER-2 not amplified, with an MIB-1 of approximately 10%. Breast MRI 12/18/2012 measured the left breast mass at 2.0 cm. There were no other masses of concern, and no suspicious internal mammary or axillary lymph nodes identified.  The patient's subsequent history is as detailed below.  INTERVAL HISTORY: [name] returns today for followup of her breast cancer. Since her last visit here she completed her radiation treatments. She generally tolerated them well. She had a little bit of fatigue, minimal skin erythema. The symptoms have largely resolved.  REVIEW OF SYSTEMS: She has mild sinus issues which she frequently develops this time of year, secondary to allergies. She has knee pain, right more than left. She is taking some nonsteroidals for this, with moderate success, but this keeps her from exercising regularly. At this point she does not wish to be referred to an orthopedist for further evaluation. She is having moderate hot flashes, particularly in the morning. A detailed review of systems today was otherwise noncontributory  PAST MEDICAL HISTORY: Past Medical History  Diagnosis Date  . PONV (postoperative nausea and vomiting)   .  Carotid artery stenosis     hx-dr dohmier monitors  . Wears glasses   . Seasonal allergies   . Breast cancer 12/12/12 bx    left breast  Bilateral carotid artery stenosis, status post dilatation  PAST SURGICAL HISTORY: Past Surgical History  Procedure Laterality Date  . Back surgery    . Wisdom tooth extraction    . Nasal polyp surgery    . Breast lumpectomy with needle localization and axillary sentinel lymph node bx Left 12/31/2012    Procedure: LEFT BREAST LUMPECTOMY WITH NEEDLE LOCALIZATION AND LEFT AXILLARY SENTINEL LYMPH NODE BIOPSY;  Surgeon: Kandis Cocking, MD;  Location: [facility];  Service: General;  Laterality: Left;  . Colonoscopy  2007   removal of a nasal polyp  FAMILY HISTORY Family History  Problem Relation Age of Onset  . Uterine cancer Mother   . Brain cancer Paternal Grandfather   . Breast cancer Cousin   . Melanoma Daughter    the patient's father died from a stroke at age 50. The patient's mother is living, currently age 43. She was diagnosed with cancer of the uterus at age 42. The patient had one grandfather diagnosed with cancer of the brain. The patient has one maternal cousin diagnosed with breast cancer in her 81s. The patient herself has 3 brothers and 3 sisters, none with cancer. The patient's daughter was diagnosed with melanoma in situ at the age of 44. She also has a history of DVT. There is no history of ovarian cancer in the family.  GYNECOLOGIC HISTORY:  Menarche age 63, first live birth age 56, the patient is GX P3. Her periods are now very irregular, she just had one 12/16/2012, the  one prior to that was 03/18/2012.  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 Her husband Casimiro Needle "Kathlene November" [place], works for [employer] in Designer, fashion/clothing ("I practically live in [place]"). Daughter Hospital doctor lives in [place] where she works as an a Radio producer. Son [name]") lives in [place] where he works as an Theatre stage manager. Son  [name] is in high school and lives with the patient at home. There are no grandchildren. The patient attends a local [address].    ADVANCED DIRECTIVES: Not in place   HEALTH MAINTENANCE: History  Substance Use Topics  . Smoking status: Never Smoker   . Smokeless tobacco: Never Used  . Alcohol Use: Yes     Comment: rare     Colonoscopy: 2007/ [facility]  PAP: June 2014  Bone density: Never  Lipid panel:  No Known Allergies  Current Outpatient Prescriptions  Medication Sig Dispense Refill  . aspirin 325 MG tablet Take 325 mg by mouth daily.      Marland Kitchen atorvastatin (LIPITOR) 10 MG tablet Take 10 mg by mouth daily.      . Calcium Carbonate-Vitamin D (CALCIUM + D PO) Take by mouth daily.      . citalopram (CELEXA) 10 MG tablet Take 10 mg by mouth daily.      . Fluticasone Propionate (FLONASE NA) Place into the nose.      . hyaluronate sodium (RADIAPLEXRX) GEL Apply 1 application topically 2 (two) times daily. Apply after rad txs and bedtime daily      . Loratadine (CLARITIN) 10 MG CAPS Take 10 mg by mouth as needed.      . Multiple Vitamins-Minerals (CENTRUM SILVER ADULT 50+ PO) Take by mouth.      . non-metallic deodorant Thornton Papas) MISC Apply 1 application topically daily as needed. Apply after rad txs daily       No current facility-administered medications for this visit.    OBJECTIVE: Middle-aged white woman in no acute distress Filed Vitals:   04/30/13 1434  BP: 149/84  Pulse: 62  Temp: 98.5 F (36.9 C)  Resp: 18     Body mass index is 29.4 kg/(m^2).    ECOG FS: 1  Sclerae unicteric, pupils equal round and reactive to light Oropharynx no thrush or other lesions No cervical or supraclavicular adenopathy Lungs no rales or rhonchi Heart regular rate and rhythm Abd soft, nontender, positive bowel sounds MSK no focal spinal tenderness, no left upper extremity edema Neuro: non-focal, well-oriented, appropriate affect Breasts: The right breast is unremarkable. The  left breast is status post lumpectomy and radiation. There is no evidence of local recurrence. The left axilla is benign.   LAB RESULTS:  CMP     Component Value Date/Time   NA 141 12/19/2012 0824   K 3.9 12/19/2012 0824   CL 107 12/19/2012 0824   CO2 24 12/19/2012 0824   GLUCOSE 78 12/19/2012 0824   BUN 15.2 12/19/2012 0824   CREATININE 0.8 12/19/2012 0824   CALCIUM 9.5 12/19/2012 0824   PROT 7.2 12/19/2012 0824   ALBUMIN 3.8 12/19/2012 0824   AST 18 12/19/2012 0824   ALT 25 12/19/2012 0824   ALKPHOS 71 12/19/2012 0824   BILITOT 0.61 12/19/2012 0824    I No results found for this basename: SPEP,  UPEP,   kappa and lambda light chains    Lab Results  Component Value Date   WBC 4.9 04/30/2013   NEUTROABS 3.3 04/30/2013   HGB 13.5 04/30/2013  HCT 40.5 04/30/2013   MCV 89.2 04/30/2013   PLT 176 04/30/2013      Chemistry      Component Value Date/Time   NA 141 12/19/2012 0824   K 3.9 12/19/2012 0824   CL 107 12/19/2012 0824   CO2 24 12/19/2012 0824   BUN 15.2 12/19/2012 0824   CREATININE 0.8 12/19/2012 0824      Component Value Date/Time   CALCIUM 9.5 12/19/2012 0824   ALKPHOS 71 12/19/2012 0824   AST 18 12/19/2012 0824   ALT 25 12/19/2012 0824   BILITOT 0.61 12/19/2012 0824       No results found for this basename: LABCA2    No components found with this basename: LABCA125    No results found for this basename: INR,  in the last 168 hours  Urinalysis No results found for this basename: colorurine,  appearanceur,  labspec,  phurine,  glucoseu,  hgbur,  bilirubinur,  ketonesur,  proteinur,  urobilinogen,  nitrite,  leukocytesur    STUDIES: No results found.  ASSESSMENT: 57 y.o. [place], [place] woman status post left breast biopsy 12/12/2012 for a clinical T1c N0, stage IA invasive ductal carcinoma, grade 2, estrogen and progesterone receptor positive, with no HER-2 amplification, and an MIB-1 of approximately 10%.  (1) left lumpectomy and sentinel lymph node sampling  12/31/2012 showed a pT1c pN0(i+), stage IIA invasive ductal carcinoma, grade 2, with repeat HER-2 again negative  (2) Oncotype recurrence score of 13 predicts a cane year risk of distant recurrence of 8% if the patient's only systemic treatment is tamoxifen for 5 years  (3) adjuvant radiation completed 04/04/2013  (4) started tamoxifen November 2014  PLAN: We spent the better part of today's 45 minute visit reviewing her antiestrogen options. Because she still having periods, even though very sporadically, I would be uncomfortable with your using an aromatase inhibitor unless she simultaneously went on goserelin. Alternatively she could have both ovaries removed. My real recommendation however is for her to start tamoxifen. She took birth control pills for a while, with no blood clotting complications. We discussed the possible toxicities and side effects of tamoxifen and after much discussion she decided she would like to give it a try.  According to the she will start tamoxifen now. She will see me again in approximately 3 months. If she tolerates it well the plan will be to do that for 3 years and then switch to an aromatase inhibitor for the final 2. She knows to call for any problems that may develop before her next visit here. Lowella Dell, MD   04/30/2013 2:58 PM

## 2013-05-09 ENCOUNTER — Other Ambulatory Visit: Payer: Self-pay | Admitting: *Deleted

## 2013-05-09 ENCOUNTER — Other Ambulatory Visit: Payer: Self-pay

## 2013-05-09 ENCOUNTER — Ambulatory Visit
Admission: RE | Admit: 2013-05-09 | Discharge: 2013-05-09 | Disposition: A | Discharge status: Home / Self Care | Payer: Commercial Indemnity | Source: Ambulatory Visit | Attending: Radiation Oncology | Admitting: Radiation Oncology

## 2013-05-09 ENCOUNTER — Encounter: Payer: Self-pay | Admitting: Radiation Oncology

## 2013-05-09 VITALS — BP 129/89 | HR 75 | Temp 98.0°F | Ht 64.5 in | Wt 173.7 lb

## 2013-05-09 DIAGNOSIS — C50412 Malignant neoplasm of upper-outer quadrant of left female breast: Secondary | ICD-10-CM

## 2013-05-09 MED ORDER — TAMOXIFEN CITRATE 20 MG PO TABS: 20.0000 mg | ORAL_TABLET | Freq: Every day | ORAL | Status: DC

## 2013-05-09 NOTE — Progress Notes (Addendum)
   Department of Radiation Oncology  Phone:  [phone] Fax:        [phone]   Name: [name] MRN: [id number]  DOB: 01/23/1956  Date: 05/09/2013  Follow Up Visit Note  Diagnosis: T1, N0, M0 basal ductal carcinoma the left breast  Summary and Interval since last radiation: 61 gray completed 04/04/13  Interval History: [name] presents today for routine followup.  She is feeling well. She has continued to use Radiaplex. She is starting to walk and exercise in addition the lifting weights. She met with Dr. Darnelle Catalan last week and I discussed tamoxifen as she still is having intermittent menstrual cycles. She did not however receive a prescription. She would like this prescription called into the cost no on [address]. She does have a thickened endometrial stripe and a polyp being monitored by her gynecologist.  Allergies: No Known Allergies  Medications:  Current Outpatient Prescriptions  Medication Sig Dispense Refill  . aspirin 325 MG tablet Take 325 mg by mouth daily.      Marland Kitchen atorvastatin (LIPITOR) 10 MG tablet Take 10 mg by mouth daily.      . Calcium Carbonate-Vitamin D (CALCIUM + D PO) Take by mouth daily.      . citalopram (CELEXA) 10 MG tablet Take 10 mg by mouth daily.      . Fluticasone Propionate (FLONASE NA) Place into the nose.      . hyaluronate sodium (RADIAPLEXRX) GEL Apply 1 application topically 2 (two) times daily. Apply after rad txs and bedtime daily      . Loratadine (CLARITIN) 10 MG CAPS Take 10 mg by mouth as needed.      . Multiple Vitamins-Minerals (CENTRUM SILVER ADULT 50+ PO) Take by mouth.      . non-metallic deodorant Thornton Papas) MISC Apply 1 application topically daily as needed. Apply after rad txs daily      . tamoxifen (NOLVADEX) 20 MG tablet Take 1 tablet (20 mg total) by mouth daily.  30 tablet  12   No current facility-administered medications for this encounter.    Physical Exam:  Filed Vitals:   05/09/13 1353  BP: 129/89  Pulse: 75  Temp: 98  F (36.7 C)   she has not returned left nipple. Her area left still dark. She has some darkness in her axilla. Her skin is healing well.  IMPRESSION: [name] is a 57 y.o. female status post radiation to the left breast with resolving acute effects of treatment.  PLAN:  I encouraged her to start using lotion with vitamin E. in order to decrease the scarring. We discussed referral to plastic surgery next year for consideration of correction of some of her poor cosmetic outcome. I will contact Dr. Darnelle Catalan regarding her tamoxifen prescription. I encouraged to use some protection in the treated area. I encouraged her to call with any questions. She has regularly scheduled followup with surgery and medical oncology. I encouraged her to contact her gynecologist before proceeding forward with tamoxifen to see if there is any further investigation he would like to do for her thickened endometrial stripe.    Lurline Hare, MD

## 2013-05-09 NOTE — Progress Notes (Signed)
[name] here today for a fu s/p radiation therapy to her left breast.  She reports intermittent sharp pain in her left breast and explained that this can be caused by irritation of the nerves in her breast. She continues to have a tan in the tx field. She denies any fatigue, and starting to walk and swim , in addition to, weighs. [name]

## 2013-05-09 NOTE — Addendum Note (Signed)
Encounter addended by: Lurline Hare, MD on: 05/09/2013  8:33 PM<BR>     Documentation filed: Notes Section

## 2013-05-10 ENCOUNTER — Telehealth: Payer: Self-pay

## 2013-05-10 NOTE — Telephone Encounter (Signed)
Got message from patient stating ok per gynecologist to start tamoxifen.Prescription sent to [facility] by Dr.Magrinat after i spoke with Mena Pauls RN on yesterday.Left message for patient ok to pick up.

## 2013-07-23 ENCOUNTER — Telehealth: Payer: Self-pay | Admitting: Oncology

## 2013-07-23 ENCOUNTER — Ambulatory Visit (HOSPITAL_BASED_OUTPATIENT_CLINIC_OR_DEPARTMENT_OTHER): Payer: Managed Care, Other (non HMO) | Admitting: Oncology

## 2013-07-23 ENCOUNTER — Other Ambulatory Visit (HOSPITAL_BASED_OUTPATIENT_CLINIC_OR_DEPARTMENT_OTHER): Payer: Managed Care, Other (non HMO)

## 2013-07-23 VITALS — BP 144/87 | HR 76 | Temp 98.0°F | Resp 20 | Ht 64.5 in | Wt 175.6 lb

## 2013-07-23 DIAGNOSIS — C50412 Malignant neoplasm of upper-outer quadrant of left female breast: Secondary | ICD-10-CM

## 2013-07-23 DIAGNOSIS — C50419 Malignant neoplasm of upper-outer quadrant of unspecified female breast: Secondary | ICD-10-CM

## 2013-07-23 DIAGNOSIS — Z170 Estrogen receptor positive status [ER+]: Secondary | ICD-10-CM

## 2013-07-23 LAB — CBC WITH DIFFERENTIAL/PLATELET
BASO%: 0.9 % (ref 0.0–2.0)
BASOS ABS: 0 10*3/uL (ref 0.0–0.1)
EOS%: 0.9 % (ref 0.0–7.0)
Eosinophils Absolute: 0 10*3/uL (ref 0.0–0.5)
HEMATOCRIT: 42.9 % (ref 34.8–46.6)
HEMOGLOBIN: 14.2 g/dL (ref 11.6–15.9)
LYMPH#: 1.5 10*3/uL (ref 0.9–3.3)
LYMPH%: 26.4 % (ref 14.0–49.7)
MCH: 29.7 pg (ref 25.1–34.0)
MCHC: 33.2 g/dL (ref 31.5–36.0)
MCV: 89.7 fL (ref 79.5–101.0)
MONO#: 0.5 10*3/uL (ref 0.1–0.9)
MONO%: 8.5 % (ref 0.0–14.0)
NEUT#: 3.5 10*3/uL (ref 1.5–6.5)
NEUT%: 63.3 % (ref 38.4–76.8)
PLATELETS: 173 10*3/uL (ref 145–400)
RBC: 4.78 10*6/uL (ref 3.70–5.45)
RDW: 12.6 % (ref 11.2–14.5)
WBC: 5.6 10*3/uL (ref 3.9–10.3)

## 2013-07-23 LAB — COMPREHENSIVE METABOLIC PANEL (CC13)
ALT: 28 U/L (ref 0–55)
ANION GAP: 9 meq/L (ref 3–11)
AST: 24 U/L (ref 5–34)
Albumin: 4 g/dL (ref 3.5–5.0)
Alkaline Phosphatase: 50 U/L (ref 40–150)
BUN: 14 mg/dL (ref 7.0–26.0)
CO2: 27 meq/L (ref 22–29)
CREATININE: 0.8 mg/dL (ref 0.6–1.1)
Calcium: 9.4 mg/dL (ref 8.4–10.4)
Chloride: 106 mEq/L (ref 98–109)
Glucose: 92 mg/dl (ref 70–140)
Potassium: 3.6 mEq/L (ref 3.5–5.1)
Sodium: 142 mEq/L (ref 136–145)
Total Bilirubin: 0.31 mg/dL (ref 0.20–1.20)
Total Protein: 7.1 g/dL (ref 6.4–8.3)

## 2013-07-23 NOTE — Progress Notes (Signed)
ID: [name] OB: 1956-06-16  MR#: [id number]  CSN#:[id number]  PCP: Irven Shelling, MD GYN:  Vania Rea SU: Alphonsa Overall OTHER MD: Thea Silversmith, Vernice Jefferson, Haena   HISTORY OF PRESENT ILLNESS: [name] had routine mammography/tomography 11/28/2012 suggesting and abnormality in the left breast. Diagnostic left mammography and ultrasonography at the breast Center 12/07/2012 confirmed a density in the upper outer quadrant of the left breast, which was not palpable by exam. Ultrasound showed an irregular hypoechoic lesion in the left breast measuring 1.5 cm. The left axilla was sonographically benign.  Biopsy of the left breast mass in question 12/12/2012 showed an invasive ductal carcinoma, grade 2, estrogen and progesterone receptor positive, HER-2 not amplified, with an MIB-1 of approximately 10%. Breast MRI 12/18/2012 measured the left breast mass at 2.0 cm. There were no other masses of concern, and no suspicious internal mammary or axillary lymph nodes identified.  The patient's subsequent history is as detailed below.  INTERVAL HISTORY: [name] returns today for followup of her breast cancer. Since her last visit here she started tamoxifen. She was able to get it at no cost to herself, through her insurance. She is tolerating it well, with no increase in hot flashes and no other side effects that she is aware of.   REVIEW OF SYSTEMS: She continues to have seasonal allergy problems. She has problems with her kneecaps and back. She is doing date [employer] program twice a week and enjoying it. She hopes to do more. She is not satisfied with the appearance of her surgical breast and would like a plastics surgical consultation. Otherwise a detailed review of systems today was noncontributory  PAST MEDICAL HISTORY: Past Medical History  Diagnosis Date  . PONV (postoperative nausea and vomiting)   . Carotid artery stenosis     hx-dr dohmier monitors  . Wears glasses   .  Seasonal allergies   . Breast cancer 12/12/12 bx    left breast  . Radiation 02/18/13-04/04/13    left breast  Bilateral carotid artery stenosis, status post dilatation  PAST SURGICAL HISTORY: Past Surgical History  Procedure Laterality Date  . Back surgery    . Wisdom tooth extraction    . Nasal polyp surgery    . Breast lumpectomy with needle localization and axillary sentinel lymph node bx Left 12/31/2012    Procedure: LEFT BREAST LUMPECTOMY WITH NEEDLE LOCALIZATION AND LEFT AXILLARY SENTINEL LYMPH NODE BIOPSY;  Surgeon: Shann Medal, MD;  Location: [facility];  Service: General;  Laterality: Left;  . Colonoscopy  2007   removal of a nasal polyp  FAMILY HISTORY Family History  Problem Relation Age of Onset  . Uterine cancer Mother   . Brain cancer Paternal Grandfather   . Breast cancer Cousin   . Melanoma Daughter    the patient's father died from a stroke at age 11. The patient's mother is living, currently age 20. She was diagnosed with cancer of the uterus at age 69. The patient had one grandfather diagnosed with cancer of the brain. The patient has one maternal cousin diagnosed with breast cancer in her 58s. The patient herself has 3 brothers and 3 sisters, none with cancer. The patient's daughter was diagnosed with melanoma in situ at the age of 72. She also has a history of DVT. There is no history of ovarian cancer in the family.  GYNECOLOGIC HISTORY:  Menarche age 71, first live birth age 89, the patient is GX P3. Her periods are now very irregular,  she just had one 12/16/2012, the one prior to that was 03/18/2012.  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 Her husband [name] "[name]" [place], works for [employer] in [place] practically live in [place]"). Daughter Museum/gallery conservator lives in [place] where she works as an a Corporate investment banker. Son [name]") lives in [place] where he works as an Physiological scientist. [name]") is in high school  and lives with the patient at home. There are no grandchildren. The patient attends a local [employer].    ADVANCED DIRECTIVES: Not in place   HEALTH MAINTENANCE: History  Substance Use Topics  . Smoking status: Never Smoker   . Smokeless tobacco: Never Used  . Alcohol Use: Yes     Comment: rare     Colonoscopy: 2007/ [facility]  PAP: June 2014  Bone density: Never  Lipid panel:  No Known Allergies  Current Outpatient Prescriptions  Medication Sig Dispense Refill  . aspirin 325 MG tablet Take 325 mg by mouth daily.      Marland Kitchen atorvastatin (LIPITOR) 10 MG tablet Take 10 mg by mouth daily.      . Calcium Carbonate-Vitamin D (CALCIUM + D PO) Take by mouth daily.      . citalopram (CELEXA) 10 MG tablet Take 10 mg by mouth daily.      . Fluticasone Propionate (FLONASE NA) Place into the nose.      . hyaluronate sodium (RADIAPLEXRX) GEL Apply 1 application topically 2 (two) times daily. Apply after rad txs and bedtime daily      . Loratadine (CLARITIN) 10 MG CAPS Take 10 mg by mouth as needed.      . Multiple Vitamins-Minerals (CENTRUM SILVER ADULT 50+ PO) Take by mouth.      . non-metallic deodorant Jethro Poling) MISC Apply 1 application topically daily as needed. Apply after rad txs daily      . tamoxifen (NOLVADEX) 20 MG tablet Take 1 tablet (20 mg total) by mouth daily.  30 tablet  12   No current facility-administered medications for this visit.    OBJECTIVE: Middle-aged white woman who appears stated age 58 Vitals:   07/23/13 1438  BP: 144/87  Pulse: 76  Temp: 98 F (36.7 C)  Resp: 20     Body mass index is 29.69 kg/(m^2).    ECOG FS: 0  Sclerae unicteric, pupils equal and round Oropharynx no thrush or other lesions No cervical or supraclavicular adenopathy Lungs no rales or rhonchi Heart regular rate and rhythm Abd soft, nontender, positive bowel sounds MSK no focal spinal tenderness, no left upper extremity edema Neuro: non-focal, well-oriented, appropriate  affect Breasts: The right breast is unremarkable. The left breast is status post lumpectomy and radiation. There is no evidence of local recurrence. The left axilla is benign.   LAB RESULTS:  CMP     Component Value Date/Time   NA 141 12/19/2012 0824   K 3.9 12/19/2012 0824   CL 107 12/19/2012 0824   CO2 24 12/19/2012 0824   GLUCOSE 78 12/19/2012 0824   BUN 15.2 12/19/2012 0824   CREATININE 0.8 12/19/2012 0824   CALCIUM 9.5 12/19/2012 0824   PROT 7.2 12/19/2012 0824   ALBUMIN 3.8 12/19/2012 0824   AST 18 12/19/2012 0824   ALT 25 12/19/2012 0824   ALKPHOS 71 12/19/2012 0824   BILITOT 0.61 12/19/2012 0824    I No results found for this basename: SPEP,  UPEP,   kappa and lambda light chains  Lab Results  Component Value Date   WBC 5.6 07/23/2013   NEUTROABS 3.5 07/23/2013   HGB 14.2 07/23/2013   HCT 42.9 07/23/2013   MCV 89.7 07/23/2013   PLT 173 07/23/2013      Chemistry      Component Value Date/Time   NA 141 12/19/2012 0824   K 3.9 12/19/2012 0824   CL 107 12/19/2012 0824   CO2 24 12/19/2012 0824   BUN 15.2 12/19/2012 0824   CREATININE 0.8 12/19/2012 0824      Component Value Date/Time   CALCIUM 9.5 12/19/2012 0824   ALKPHOS 71 12/19/2012 0824   AST 18 12/19/2012 0824   ALT 25 12/19/2012 0824   BILITOT 0.61 12/19/2012 0824       No results found for this basename: LABCA2    No components found with this basename: LABCA125    No results found for this basename: INR,  in the last 168 hours  Urinalysis No results found for this basename: colorurine,  appearanceur,  labspec,  phurine,  glucoseu,  hgbur,  bilirubinur,  ketonesur,  proteinur,  urobilinogen,  nitrite,  leukocytesur    STUDIES: No results found.  ASSESSMENT: 58 y.o. [place], [place] woman status post left breast biopsy 12/12/2012 for a clinical T1c N0, stage IA invasive ductal carcinoma, grade 2, estrogen and progesterone receptor positive, with no HER-2 amplification, and an MIB-1 of approximately 10%.  (1) left  lumpectomy and sentinel lymph node sampling 12/31/2012 showed a pT1c pN0(i+), stage IIA invasive ductal carcinoma, grade 2, with repeat HER-2 again negative  (2) Oncotype recurrence score of 13 predicts a cane year risk of distant recurrence of 8% if the patient's only systemic treatment is tamoxifen for 5 years  (3) adjuvant radiation completed 04/04/2013  (4) started tamoxifen November 2014  PLAN: Narcisa is tolerating the tamoxifen without any unusual side effects. The plan is going to be to continue that for at least 3 years, then consider switching to an aromatase inhibitor. If she sees Dr. Lucia Gaskins in April she can see [place] again in July. We can then see her again following January and thereafter yearly.  She is interested in the possibility of reconstruction. I am referring her to Crissie Reese for evaluation.   The patient knows to call for any problems that may develop before next visit here.    Chauncey Cruel, MD   07/23/2013 2:44 PM

## 2013-07-23 NOTE — Telephone Encounter (Signed)
, °

## 2013-07-24 ENCOUNTER — Other Ambulatory Visit: Payer: Self-pay | Admitting: Obstetrics & Gynecology

## 2013-07-24 DIAGNOSIS — Z9889 Other specified postprocedural states: Secondary | ICD-10-CM

## 2013-07-24 DIAGNOSIS — Z853 Personal history of malignant neoplasm of breast: Secondary | ICD-10-CM

## 2013-07-24 LAB — FOLLICLE STIMULATING HORMONE: FSH: 35.5 m[IU]/mL

## 2013-07-28 LAB — ESTRADIOL, ULTRA SENS: ESTRADIOL, ULTRA SENSITIVE: 7 pg/mL

## 2013-10-25 ENCOUNTER — Ambulatory Visit (INDEPENDENT_AMBULATORY_CARE_PROVIDER_SITE_OTHER): Payer: Managed Care, Other (non HMO) | Admitting: Surgery

## 2013-11-21 ENCOUNTER — Encounter (INDEPENDENT_AMBULATORY_CARE_PROVIDER_SITE_OTHER): Payer: Self-pay | Admitting: Surgery

## 2013-11-21 ENCOUNTER — Ambulatory Visit (INDEPENDENT_AMBULATORY_CARE_PROVIDER_SITE_OTHER): Payer: Managed Care, Other (non HMO) | Admitting: Surgery

## 2013-11-21 VITALS — BP 138/88 | HR 78 | Temp 98.0°F | Resp 18 | Ht 64.0 in | Wt 176.0 lb

## 2013-11-21 DIAGNOSIS — C50412 Malignant neoplasm of upper-outer quadrant of left female breast: Secondary | ICD-10-CM

## 2013-11-21 DIAGNOSIS — C50419 Malignant neoplasm of upper-outer quadrant of unspecified female breast: Secondary | ICD-10-CM

## 2013-11-21 NOTE — Progress Notes (Signed)
Re:   [name] DOB:   08-02-1955 MRN:   [id number]  [place]  ASSESSMENT AND PLAN: 1.  Left breast cancer - at 1 o'clock  Path - 1.5 IDC, Grade 2, ER 100%, PR 100%, Her2Neu - neg., Ki67 - 12%. 0/1 nodes (but node had isolated tumor cells)  Oncology - Magrinat and Pablo Ledger.  Left breast lumpectomy and left axillary SLNBx - 12/31/2012  Oncotype - 13, recurrence risk 8%.  Radiation tx - 02/18/2013 - 04/04/2013.  On tamoxifen - but having some trouble with hot flashes.   She'll see me in 6 months.  1a. Left nipple pulled up.  She saw Dr. Harlow Mares, but she is not sure she wants to go through the process of correcting this.  2.  History of int carotid stenosis (secondary to fibromuscular dysplasia) - she underwent carotid artery dilatation in 2003.  Drs. Dohmeir and Editor, commissioning on chronic aspirin for this 3.  On chronic aspirin for #2. 4.  Hypercholesterolemia   REFERRING PHYSICIAN: Irven Shelling, MD  HISTORY OF PRESENT ILLNESS: [name] is a 58 y.o. (DOB: 11-03-1955)  white  female whose primary care physician is Irven Shelling, MD and comes for follow up of a left breast lumpecotmy.   She comes by herself today.  She has a head cold.  One complaint she has is hot flashes from tamoxifen.  She has yet to talk to Dr. Virgie Dad office about this.  She did see Dr. Harlow Mares, but has not decided whether to go through with anything. She is getting mammograms later this month.  History of Breast Cancer: The patient went for her routine mammogram.  Her last mammogram was about 15 months ago.  The mammogram at [facility] showed a 1.5 x 1.3 cm mass at the 1 o'clock position.  A core biopsy showed an Invasive Ductal Carcinoma.  A breast MRI on 12/18/2012 showed a 2.0 x 1.4 cm mass in the UOQ of the left breast. She has a cousin who had breast cancer, but no first degree relative.  She went 9 months without a period, then recently had a small period.  She has seen Dr. Ascencion Dike for  this.  He is checking some blood work to see if she has gone through menopause.  She is not on hormone meds.   Past Medical History  Diagnosis Date  . PONV (postoperative nausea and vomiting)   . Carotid artery stenosis     hx-dr dohmier monitors  . Wears glasses   . Seasonal allergies   . Breast cancer 12/12/12 bx    left breast  . Radiation 02/18/13-04/04/13    left breast     Current Outpatient Prescriptions  Medication Sig Dispense Refill  . aspirin 325 MG tablet Take 325 mg by mouth daily.      Marland Kitchen atorvastatin (LIPITOR) 10 MG tablet Take 10 mg by mouth daily.      . Calcium Carbonate-Vitamin D (CALCIUM + D PO) Take by mouth daily.      . citalopram (CELEXA) 10 MG tablet Take 10 mg by mouth daily.      . Fluticasone Propionate (FLONASE NA) Place into the nose.      . hyaluronate sodium (RADIAPLEXRX) GEL Apply 1 application topically 2 (two) times daily. Apply after rad txs and bedtime daily      . Loratadine (CLARITIN) 10 MG CAPS Take 10 mg by mouth as needed.      . Multiple Vitamins-Minerals (CENTRUM SILVER ADULT 50+ PO)  Take by mouth.      . non-metallic deodorant Jethro Poling) MISC Apply 1 application topically daily as needed. Apply after rad txs daily      . tamoxifen (NOLVADEX) 20 MG tablet Take 1 tablet (20 mg total) by mouth daily.  30 tablet  12   No current facility-administered medications for this visit.     No Known Allergies  REVIEW OF SYSTEMS: Neurologic:  Prior carotid artery dilatation around 2002.  Followed by Dr. Brett Fairy. Endocrine:  No diabetes. No thyroid disease.  Hypercholesterolemia. Gastrointestinal:  No history of stomach disease.  No history of liver disease.  No history of gall bladder disease.  No history of pancreas disease.  No history of colon disease.  Some rectal bleeding attributed to hemorrhoids.  Negative colonoscopy at age 15 - Dr. Mellody Memos. Urologic:  No history of kidney stones.  No history of bladder infections. Musculoskeletal:  Back  surgery, Dr. Trenton Gammon, 11/2000 Hematologic:  No bleeding disorder. On aspirin.  SOCIAL and FAMILY HISTORY: Married. Husband and daughter, Museum/gallery conservator. Husband had lymphoma treated by Dr. Lamonte Sakai.  Someone in my group did the original abdominal biopsy.  PHYSICAL EXAM: There were no vitals taken for this visit.  General: WN WF who is alert and generally healthy appearing.  Neck: Supple. No mass.  No thyroid mass. Breasts:  Right - unremarkable  Left -  Area above left nipple is firm - but this is normal post op.  She does have her left nipple pulled up.  I would hope that this will get better as she gets further out from surgery.  DATA REVIEWED: Epic notes.  For mammograms later this month.  Alphonsa Overall, MD,  [facility], [address]  [place], [place]    [place] Phone:  [phone] FAX:  [phone]

## 2013-11-29 ENCOUNTER — Ambulatory Visit
Admission: RE | Admit: 2013-11-29 | Discharge: 2013-11-29 | Disposition: A | Discharge status: Home / Self Care | Payer: 59 | Source: Ambulatory Visit | Attending: Obstetrics & Gynecology | Admitting: Obstetrics & Gynecology

## 2013-11-29 DIAGNOSIS — Z9889 Other specified postprocedural states: Secondary | ICD-10-CM

## 2013-11-29 DIAGNOSIS — Z853 Personal history of malignant neoplasm of breast: Secondary | ICD-10-CM

## 2013-12-24 ENCOUNTER — Other Ambulatory Visit: Payer: Self-pay | Admitting: Obstetrics & Gynecology

## 2013-12-25 LAB — CYTOLOGY - PAP

## 2013-12-30 ENCOUNTER — Telehealth: Payer: Self-pay | Admitting: Physician Assistant

## 2013-12-30 NOTE — Telephone Encounter (Signed)
pt cld to state needed to r/s from 7/20 to 7/30-r/s and advised pt of new time & date-pt understand

## 2014-01-15 ENCOUNTER — Encounter: Payer: Self-pay | Admitting: Oncology

## 2014-01-16 ENCOUNTER — Encounter: Payer: Self-pay | Admitting: *Deleted

## 2014-01-16 NOTE — Telephone Encounter (Signed)
Message printed and to provider for review.  Called patient for further assessment at 3:19.  Entered voicemail but call lost.  1525 Sent MyChart message asking return call and was able to leave voicemail on her mobile number [phone].

## 2014-01-20 ENCOUNTER — Other Ambulatory Visit: Payer: Managed Care, Other (non HMO)

## 2014-01-20 ENCOUNTER — Ambulatory Visit: Payer: Managed Care, Other (non HMO) | Admitting: Physician Assistant

## 2014-01-20 NOTE — Telephone Encounter (Signed)
No return call or further messages from patient.  Collaborative nurse notified and also has not received a call from this patient.  Collaborative reports trying to call patient 01-17-2014 but no "voicemail set up yet".  This nurse closed awaiting return communication from patient.

## 2014-01-30 ENCOUNTER — Telehealth: Payer: Self-pay | Admitting: Nurse Practitioner

## 2014-01-30 ENCOUNTER — Other Ambulatory Visit (HOSPITAL_BASED_OUTPATIENT_CLINIC_OR_DEPARTMENT_OTHER): Payer: 59

## 2014-01-30 ENCOUNTER — Ambulatory Visit (HOSPITAL_BASED_OUTPATIENT_CLINIC_OR_DEPARTMENT_OTHER): Payer: 59 | Admitting: Nurse Practitioner

## 2014-01-30 VITALS — BP 153/89 | HR 103 | Temp 98.8°F | Resp 18 | Ht 64.0 in | Wt 176.4 lb

## 2014-01-30 DIAGNOSIS — N951 Menopausal and female climacteric states: Secondary | ICD-10-CM

## 2014-01-30 DIAGNOSIS — R232 Flushing: Secondary | ICD-10-CM

## 2014-01-30 DIAGNOSIS — Z170 Estrogen receptor positive status [ER+]: Secondary | ICD-10-CM

## 2014-01-30 DIAGNOSIS — C50412 Malignant neoplasm of upper-outer quadrant of left female breast: Secondary | ICD-10-CM

## 2014-01-30 DIAGNOSIS — C50419 Malignant neoplasm of upper-outer quadrant of unspecified female breast: Secondary | ICD-10-CM

## 2014-01-30 DIAGNOSIS — T451X5A Adverse effect of antineoplastic and immunosuppressive drugs, initial encounter: Secondary | ICD-10-CM | POA: Insufficient documentation

## 2014-01-30 LAB — CBC WITH DIFFERENTIAL/PLATELET
BASO%: 1.1 % (ref 0.0–2.0)
BASOS ABS: 0.1 10*3/uL (ref 0.0–0.1)
EOS ABS: 0.1 10*3/uL (ref 0.0–0.5)
EOS%: 0.9 % (ref 0.0–7.0)
HCT: 40.5 % (ref 34.8–46.6)
HEMOGLOBIN: 13.5 g/dL (ref 11.6–15.9)
LYMPH%: 31.8 % (ref 14.0–49.7)
MCH: 29.6 pg (ref 25.1–34.0)
MCHC: 33.3 g/dL (ref 31.5–36.0)
MCV: 88.8 fL (ref 79.5–101.0)
MONO#: 0.4 10*3/uL (ref 0.1–0.9)
MONO%: 7.8 % (ref 0.0–14.0)
NEUT%: 58.4 % (ref 38.4–76.8)
NEUTROS ABS: 3.2 10*3/uL (ref 1.5–6.5)
Platelets: 178 10*3/uL (ref 145–400)
RBC: 4.56 10*6/uL (ref 3.70–5.45)
RDW: 12.9 % (ref 11.2–14.5)
WBC: 5.5 10*3/uL (ref 3.9–10.3)
lymph#: 1.8 10*3/uL (ref 0.9–3.3)

## 2014-01-30 LAB — COMPREHENSIVE METABOLIC PANEL (CC13)
ALBUMIN: 3.7 g/dL (ref 3.5–5.0)
ALT: 53 U/L (ref 0–55)
AST: 37 U/L — ABNORMAL HIGH (ref 5–34)
Alkaline Phosphatase: 47 U/L (ref 40–150)
Anion Gap: 7 mEq/L (ref 3–11)
BUN: 13.5 mg/dL (ref 7.0–26.0)
CALCIUM: 9.6 mg/dL (ref 8.4–10.4)
CO2: 27 mEq/L (ref 22–29)
Chloride: 109 mEq/L (ref 98–109)
Creatinine: 0.8 mg/dL (ref 0.6–1.1)
GLUCOSE: 104 mg/dL (ref 70–140)
POTASSIUM: 3.6 meq/L (ref 3.5–5.1)
Sodium: 143 mEq/L (ref 136–145)
Total Bilirubin: 0.37 mg/dL (ref 0.20–1.20)
Total Protein: 6.7 g/dL (ref 6.4–8.3)

## 2014-01-30 MED ORDER — GABAPENTIN 300 MG PO CAPS: 300.0000 mg | ORAL_CAPSULE | Freq: Every day | ORAL | Status: DC

## 2014-01-30 NOTE — Progress Notes (Addendum)
ID: [name] OB: July 14, 1955  MR#: [id number]  CSN#:[id number]  PCP: Irven Shelling, MD GYN:  Vania Rea SU: Alphonsa Overall OTHER MD: Thea Silversmith, Vernice Jefferson, Asencion Partridge Dohmeier  CHIEF COMPLAINT: Left invasive ductal breast cancer  CURRENT THERAPY: taxomifen 84m PO daily  HISTORY OF PRESENT ILLNESS: JSinahad routine mammography/tomography 11/28/2012 suggesting and abnormality in the left breast. Diagnostic left mammography and ultrasonography at the [facility] Center 12/07/2012 confirmed a density in the upper outer quadrant of the left breast, which was not palpable by exam. Ultrasound showed an irregular hypoechoic lesion in the left breast measuring 1.5 cm. The left axilla was sonographically benign.  Biopsy of the left breast mass in question 12/12/2012 showed an invasive ductal carcinoma, grade 2, estrogen and progesterone receptor positive, HER-2 not amplified, with an MIB-1 of approximately 10%. Breast MRI 12/18/2012 measured the left breast mass at 2.0 cm. There were no other masses of concern, and no suspicious internal mammary or axillary lymph nodes identified.  The patient's subsequent history is as detailed below.  INTERVAL HISTORY: [name] today for follow up of her left breast cancer. She obtains the tamoxifen at no cost to herself through her insurance. Her interval history is unremarkable. Her only exercise as of now is gardening, but [facility]   REVIEW OF SYSTEMS: JAlixandreahas recently begun to experience aggravating hot flashes, mostly at night. She has, on occasion, had to change her sheets in the middle of the night. She expresses a desire to have medication prescribed to treat this. [name] has mild vaginal wetness, but nothing that she has needed to wear a pad for. Besides the hot flashes, her main complaint is bilateral ankle and knee pain. It is worse in the morning, very stiff and painful, but gets  better throughout the day and with movement.   PAST MEDICAL HISTORY: Past Medical History  Diagnosis Date  . PONV (postoperative nausea and vomiting)   . Carotid artery stenosis     hx-dr dohmier monitors  . Wears glasses   . Seasonal allergies   . Breast cancer 12/12/12 bx    left breast  . Radiation 02/18/13-04/04/13    left breast  Bilateral carotid artery stenosis, status post dilatation  PAST SURGICAL HISTORY: Past Surgical History  Procedure Laterality Date  . Back surgery    . Wisdom tooth extraction    . Nasal polyp surgery    . Breast lumpectomy with needle localization and axillary sentinel lymph node bx Left 12/31/2012    Procedure: LEFT BREAST LUMPECTOMY WITH NEEDLE LOCALIZATION AND LEFT AXILLARY SENTINEL LYMPH NODE BIOPSY;  Surgeon: DShann Medal MD;  [facility]  Service: General;  Laterality: Left;  . Colonoscopy  2007   removal of a nasal polyp  FAMILY HISTORY Family History  Problem Relation Age of Onset  . Uterine cancer Mother   . Brain cancer Paternal Grandfather   . Breast cancer Cousin   . Melanoma Daughter    the patient's father died from a stroke at age 58 The patient's mother is living, currently age 58 She was diagnosed with cancer of the uterus at age 58 The patient had one grandfather diagnosed with cancer of the brain. The patient has one maternal cousin diagnosed with breast cancer in her 658s The patient herself has 3 brothers and 3 sisters, none with cancer. The patient's daughter was diagnosed with melanoma in situ at the age of 227  She also has a history of DVT. There is no history of ovarian cancer in the family.  GYNECOLOGIC HISTORY:  Menarche age 83, first live birth age 34, the patient is [place] P3. Her periods are now very irregular, she just had one 12/16/2012, the one prior to that was 03/18/2012.  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 Her husband [name] "[name]" Baldwin, works for [employer] in  [place] practically live in [place]"). Daughter Museum/gallery conservator lives in [place] where she works as an a Corporate investment banker. Son Levora Angel") lives in [place] where he works as an Physiological scientist. [name]") is in high school and lives with the patient at home. There are no grandchildren. The patient attends a local [employer].    ADVANCED DIRECTIVES: Not in place   HEALTH MAINTENANCE: History  Substance Use Topics  . Smoking status: Never Smoker   . Smokeless tobacco: Never Used  . Alcohol Use: Yes     Comment: rare     Colonoscopy: 2007/ [facility]  PAP: June 2015  Bone density: Never  Lipid panel:  No Known Allergies  Current Outpatient Prescriptions  Medication Sig Dispense Refill  . aspirin 325 MG tablet Take 325 mg by mouth daily.      Marland Kitchen atorvastatin (LIPITOR) 10 MG tablet Take 10 mg by mouth daily.      . Calcium Carbonate-Vitamin D (CALCIUM + D PO) Take by mouth daily.      . Multiple Vitamins-Minerals (CENTRUM SILVER ADULT 50+ PO) Take by mouth.      . tamoxifen (NOLVADEX) 20 MG tablet Take 1 tablet (20 mg total) by mouth daily.  30 tablet  12  . Fluticasone Propionate (FLONASE NA) Place into the nose.      . gabapentin (NEURONTIN) 300 MG capsule Take 1 capsule (300 mg total) by mouth at bedtime.  90 capsule  3  . Loratadine (CLARITIN) 10 MG CAPS Take 10 mg by mouth as needed.      . non-metallic deodorant Jethro Poling) MISC Apply 1 application topically daily as needed. Apply after rad txs daily       No current facility-administered medications for this visit.    OBJECTIVE: Middle-aged white woman who appears stated age 70 Vitals:   01/30/14 1406  BP: 153/89  Pulse: 103  Temp: 98.8 F (37.1 C)  Resp: 18     Body mass index is 30.26 kg/(m^2).    ECOG FS: 0  Skin: warm, dry  HEENT: sclerae anicteric, conjunctivae pink, oropharynx clear. No thrush or mucositis.  Lymph Nodes: No cervical or supraclavicular lymphadenopathy  Lungs: clear to auscultation  bilaterally, no rales, wheezes, or rhonci  Heart: regular rate and rhythm  Abdomen: round, soft, non tender, positive bowel sounds  Musculoskeletal: No focal spinal tenderness, no peripheral edema  Neuro: non focal, well oriented Breast: left breast status post lumpectomy and radiation. No evidence of local recurrence. Left axilla benign. Right breast unremarkable.    LAB RESULTS:  CMP     Component Value Date/Time   NA 143 01/30/2014 1354   K 3.6 01/30/2014 1354   CL 107 12/19/2012 0824   CO2 27 01/30/2014 1354   GLUCOSE 104 01/30/2014 1354   GLUCOSE 78 12/19/2012 0824   BUN 13.5 01/30/2014 1354   CREATININE 0.8 01/30/2014 1354   CALCIUM 9.6 01/30/2014 1354   PROT 6.7 01/30/2014 1354   ALBUMIN 3.7 01/30/2014 1354   AST 37* 01/30/2014 1354   ALT 53 01/30/2014  1354   ALKPHOS 47 01/30/2014 1354   BILITOT 0.37 01/30/2014 1354    I No results found for this basename: SPEP,  UPEP,   kappa and lambda light chains    Lab Results  Component Value Date   WBC 5.5 01/30/2014   NEUTROABS 3.2 01/30/2014   HGB 13.5 01/30/2014   HCT 40.5 01/30/2014   MCV 88.8 01/30/2014   PLT 178 01/30/2014      Chemistry      Component Value Date/Time   NA 143 01/30/2014 1354   K 3.6 01/30/2014 1354   CL 107 12/19/2012 0824   CO2 27 01/30/2014 1354   BUN 13.5 01/30/2014 1354   CREATININE 0.8 01/30/2014 1354      Component Value Date/Time   CALCIUM 9.6 01/30/2014 1354   ALKPHOS 47 01/30/2014 1354   AST 37* 01/30/2014 1354   ALT 53 01/30/2014 1354   BILITOT 0.37 01/30/2014 1354       No results found for this basename: LABCA2    No components found with this basename: LABCA125    No results found for this basename: INR,  in the last 168 hours  Urinalysis No results found for this basename: colorurine,  appearanceur,  labspec,  phurine,  glucoseu,  hgbur,  bilirubinur,  ketonesur,  proteinur,  urobilinogen,  nitrite,  leukocytesur    STUDIES: Most recent mammogram on 11/29/2013 was unremarkable    ASSESSMENT: 58 y.o. [place], [place] woman status post left breast biopsy 12/12/2012 for a clinical T1c N0, stage IA invasive ductal carcinoma, grade 2, estrogen and progesterone receptor positive, with no HER-2 amplification, and an MIB-1 of approximately 10%.  (1) left lumpectomy and sentinel lymph node sampling 12/31/2012 showed a pT1c pN0(i+), stage IIA invasive ductal carcinoma, grade 2, with repeat HER-2 again negative  (2) Oncotype recurrence score of 13 predicts a cane year risk of distant recurrence of 8% if the patient's only systemic treatment is tamoxifen for 5 years  (3) adjuvant radiation completed 04/04/2013  (4) started tamoxifen November 2014  PLAN: Ahniya is doing well as far as her breast cancer is concerned. So far, her main side effect with the tamoxifen is nightly hot flashes. We discussed starting her on 337m gabapentin QHS to help with these, and she is in agreement. We discussed possible side effects, such as drowsiness. A paper prescription was given to her in clinic.   The plan is to continue the tamoxifen for at least 3 years and then discuss switching to one of the aromatase inhibitors. [facility] or start walking in her neighborhood.We will see her in the office again in early February 2016.   JMickelleunderstands and is in agreement with the plan. She has been encouraged to call the clinic with any issue that might arise before next visit.   FMarcelino Duster NP   01/30/2014 4:28 PM   ADDENDUM: JShandrellis doing fine as far as her breast cancer is concerned and we wil continue tamoxifen at least through Nov 2016. We will obtain a bone density before that date to help uKoreawith the aromatase inhibitor decision.  I personally saw this patient and performed a substantive portion of this encounter with the listed APP documented above.   MChauncey Cruel MD

## 2014-01-30 NOTE — Assessment & Plan Note (Signed)
her main side effect with the tamoxifen is nightly hot flashes. We discussed starting her on 300mg  gabapentin QHS to help with these, and she is in agreement. We discussed possible side effects, such as drowsiness. A paper prescription was given to her in clinic.

## 2014-01-30 NOTE — Assessment & Plan Note (Signed)
The plan is to continue the tamoxifen for at least 3 years and then discuss switching to one of the aromatase inhibitors. Follow up visit next year in Feb 2016

## 2014-05-18 ENCOUNTER — Other Ambulatory Visit: Payer: Self-pay | Admitting: Oncology

## 2014-06-13 ENCOUNTER — Other Ambulatory Visit: Payer: Self-pay | Admitting: Oncology

## 2014-06-13 DIAGNOSIS — C50412 Malignant neoplasm of upper-outer quadrant of left female breast: Secondary | ICD-10-CM

## 2014-07-12 IMAGING — MG MM DIGITAL DIAGNOSTIC BILAT
5 series · 5 of 5 positions shown · non-contrast
Comparison: With priors

CLINICAL DATA: History of left breast cancer, status post
lumpectomy in December 2012. Annual examination. The patient is
asymptomatic.

EXAM:
DIGITAL DIAGNOSTIC  BILATERAL MAMMOGRAM WITH CAD

[L CC]
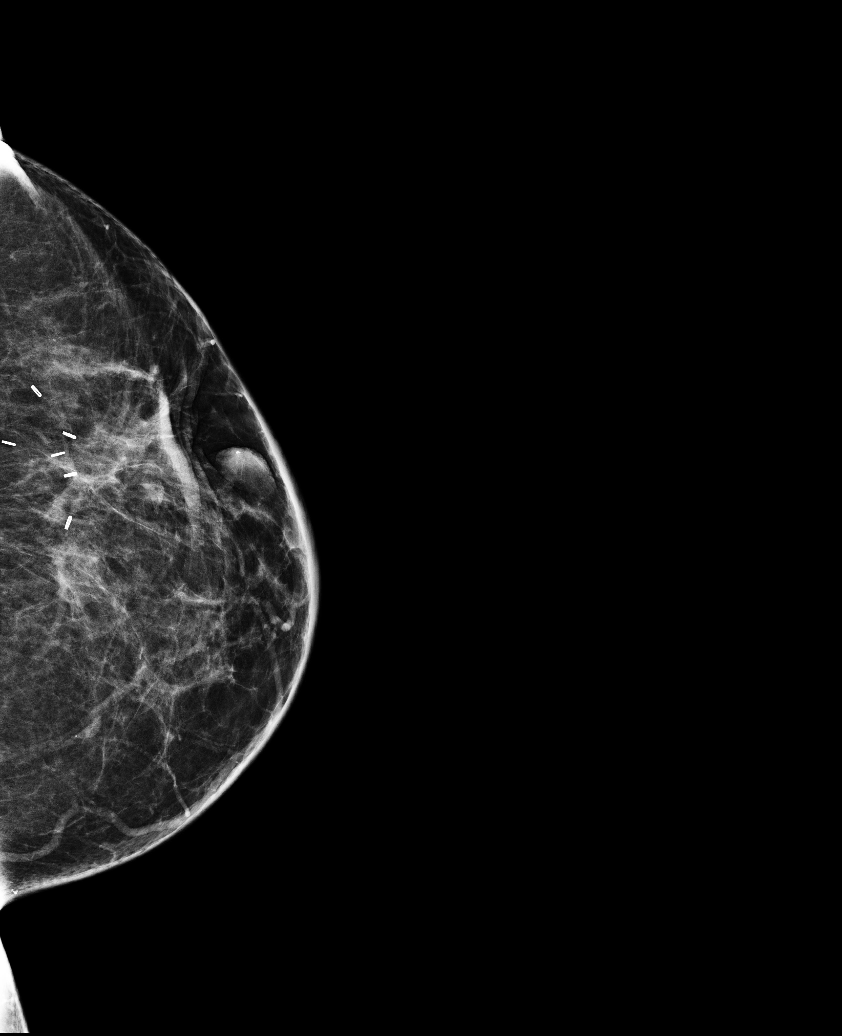

[R CC]
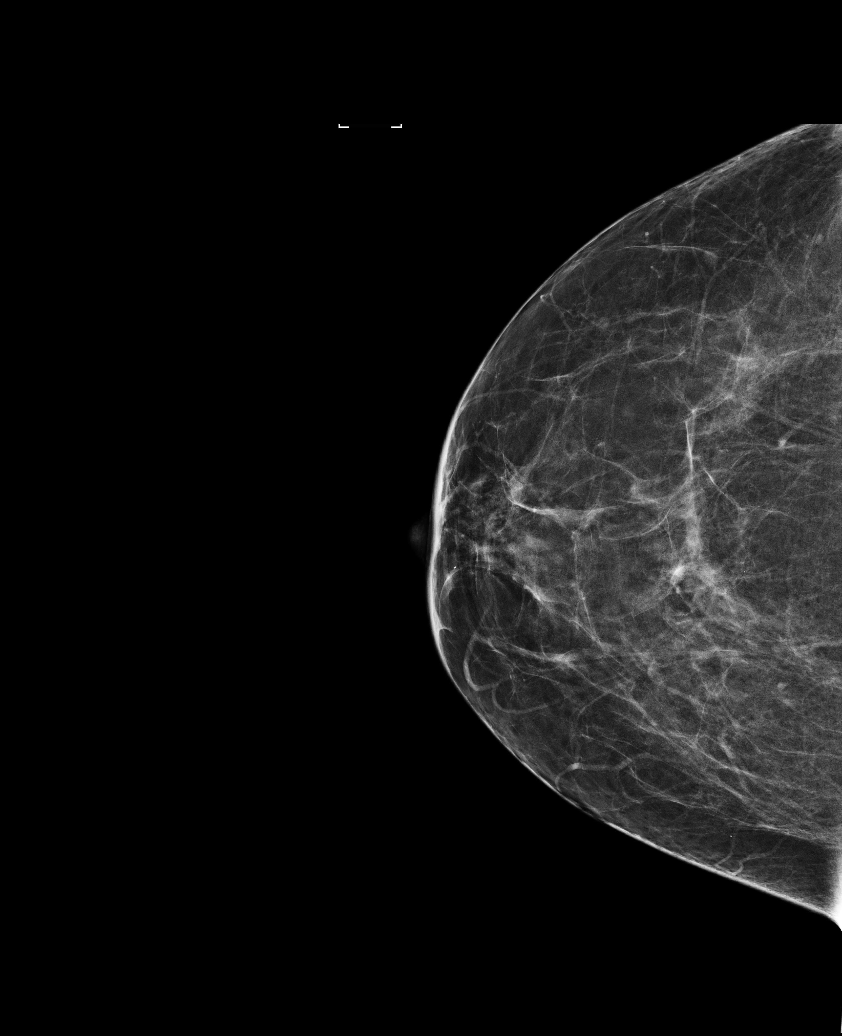

[L TAN]
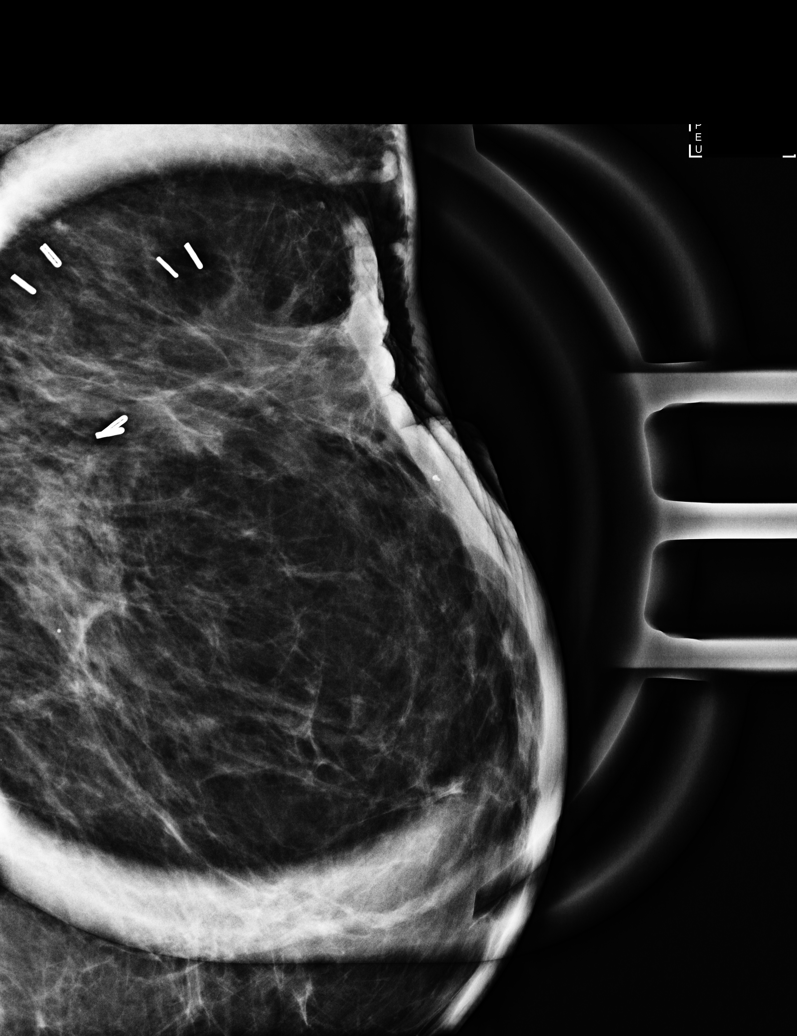

[L MLO]
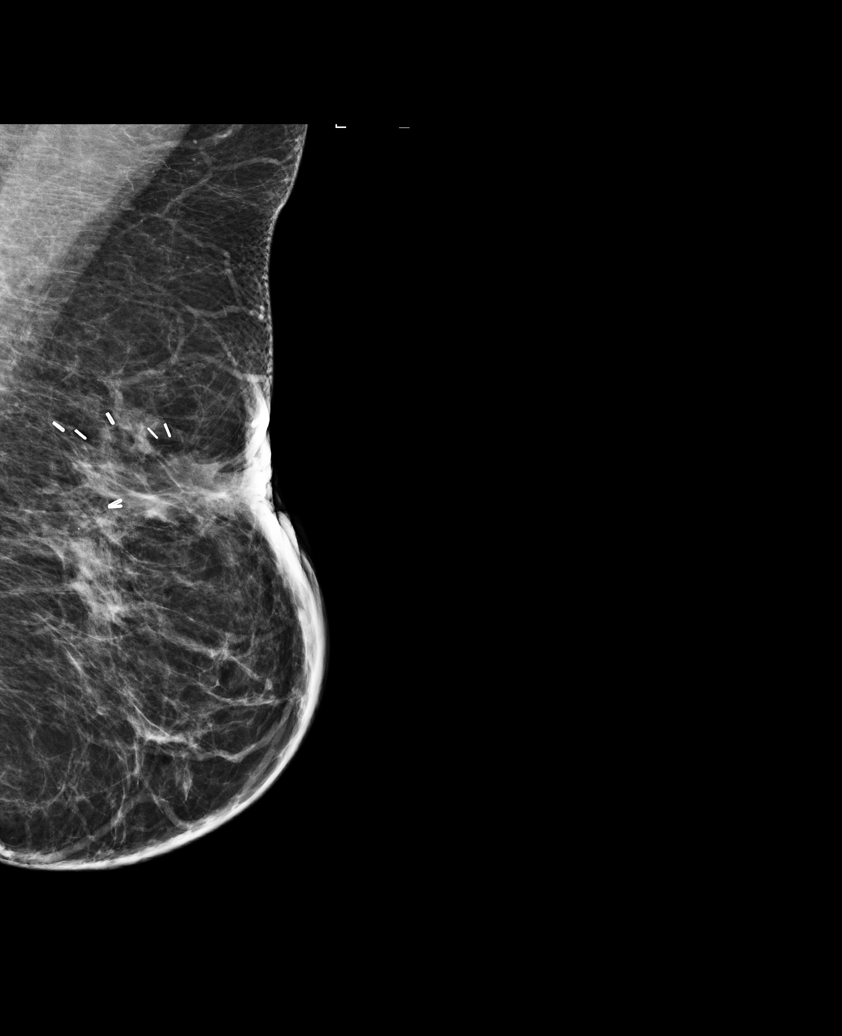

[R MLO]
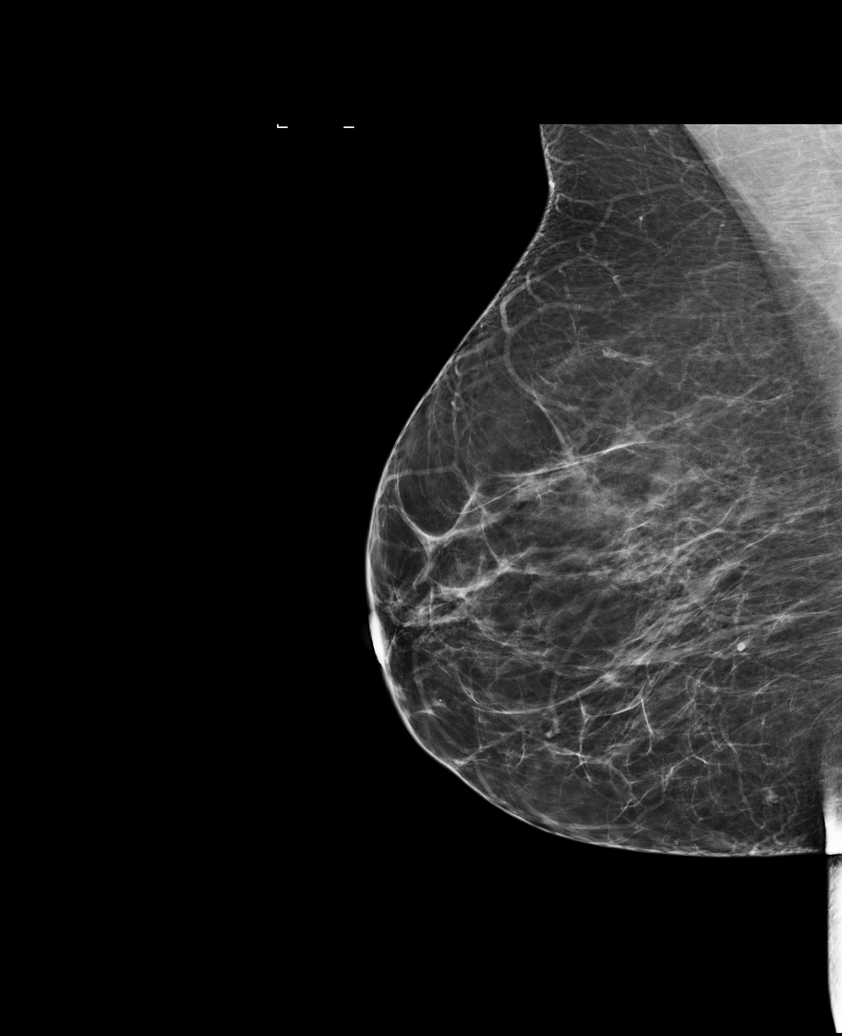

[5 of 5 positions shown; findings below may reference images not displayed]

ACR Breast Density Category b: There are scattered areas of
fibroglandular density.
FINDINGS: There are lumpectomy changes in the upper slightly outer left
breast. No mass, nonsurgical distortion, or suspicious
microcalcification is identified in either breast to suggest
malignancy.

Mammographic images were processed with CAD.
IMPRESSION: Lumpectomy changes in the left breast. No evidence of malignancy in
either breast.

RECOMMENDATION:
Diagnostic mammogram is suggested in 1 year. (Code:[id number])

I have discussed the findings and recommendations with the patient.
Results were also provided in writing at the conclusion of the
visit. If applicable, a reminder letter will be sent to the patient
regarding the next appointment.

BI-RADS CATEGORY  2: Benign Finding(s)

## 2014-08-05 ENCOUNTER — Telehealth: Payer: Self-pay | Admitting: Nurse Practitioner

## 2014-08-05 NOTE — Telephone Encounter (Signed)
per HF tomove pt to 2/17 due to PAL-cld & left pt a message of r/s time & date

## 2014-08-06 ENCOUNTER — Other Ambulatory Visit: Payer: 59

## 2014-08-11 ENCOUNTER — Other Ambulatory Visit (HOSPITAL_BASED_OUTPATIENT_CLINIC_OR_DEPARTMENT_OTHER): Payer: 59

## 2014-08-11 ENCOUNTER — Encounter: Payer: Self-pay | Admitting: Nurse Practitioner

## 2014-08-11 ENCOUNTER — Telehealth: Payer: Self-pay | Admitting: Nurse Practitioner

## 2014-08-11 ENCOUNTER — Ambulatory Visit (HOSPITAL_BASED_OUTPATIENT_CLINIC_OR_DEPARTMENT_OTHER): Payer: 59 | Admitting: Nurse Practitioner

## 2014-08-11 ENCOUNTER — Other Ambulatory Visit: Payer: Self-pay | Admitting: *Deleted

## 2014-08-11 VITALS — BP 158/82 | HR 97 | Temp 98.1°F | Resp 18 | Ht 64.0 in | Wt 177.3 lb

## 2014-08-11 DIAGNOSIS — R300 Dysuria: Secondary | ICD-10-CM

## 2014-08-11 DIAGNOSIS — C50412 Malignant neoplasm of upper-outer quadrant of left female breast: Secondary | ICD-10-CM

## 2014-08-11 DIAGNOSIS — Z170 Estrogen receptor positive status [ER+]: Secondary | ICD-10-CM

## 2014-08-11 LAB — URINALYSIS, MICROSCOPIC - CHCC
Bilirubin (Urine): NEGATIVE
Glucose: NEGATIVE mg/dL
Ketones: NEGATIVE mg/dL
NITRITE: NEGATIVE
PH: 7 (ref 4.6–8.0)
Protein: NEGATIVE mg/dL
Specific Gravity, Urine: 1.01 (ref 1.003–1.035)
UROBILINOGEN UR: 0.2 mg/dL (ref 0.2–1)

## 2014-08-11 LAB — COMPREHENSIVE METABOLIC PANEL (CC13)
ALT: 47 U/L (ref 0–55)
AST: 34 U/L (ref 5–34)
Albumin: 3.9 g/dL (ref 3.5–5.0)
Alkaline Phosphatase: 60 U/L (ref 40–150)
Anion Gap: 11 mEq/L (ref 3–11)
BUN: 14.5 mg/dL (ref 7.0–26.0)
CALCIUM: 9.1 mg/dL (ref 8.4–10.4)
CHLORIDE: 107 meq/L (ref 98–109)
CO2: 26 meq/L (ref 22–29)
Creatinine: 0.8 mg/dL (ref 0.6–1.1)
EGFR: 81 mL/min/{1.73_m2} — ABNORMAL LOW (ref >90)
Glucose: 119 mg/dl (ref 70–140)
Potassium: 3.8 mEq/L (ref 3.5–5.1)
SODIUM: 143 meq/L (ref 136–145)
Total Bilirubin: 0.32 mg/dL (ref 0.20–1.20)
Total Protein: 6.9 g/dL (ref 6.4–8.3)

## 2014-08-11 LAB — CBC WITH DIFFERENTIAL/PLATELET
BASO%: 1.4 % (ref 0.0–2.0)
Basophils Absolute: 0.1 10*3/uL (ref 0.0–0.1)
EOS%: 0.7 % (ref 0.0–7.0)
Eosinophils Absolute: 0 10*3/uL (ref 0.0–0.5)
HCT: 43.7 % (ref 34.8–46.6)
HEMOGLOBIN: 14.1 g/dL (ref 11.6–15.9)
LYMPH%: 23.5 % (ref 14.0–49.7)
MCH: 28.6 pg (ref 25.1–34.0)
MCHC: 32.2 g/dL (ref 31.5–36.0)
MCV: 89 fL (ref 79.5–101.0)
MONO#: 0.4 10*3/uL (ref 0.1–0.9)
MONO%: 7 % (ref 0.0–14.0)
NEUT#: 4.1 10*3/uL (ref 1.5–6.5)
NEUT%: 67.4 % (ref 38.4–76.8)
Platelets: 190 10*3/uL (ref 145–400)
RBC: 4.91 10*6/uL (ref 3.70–5.45)
RDW: 13 % (ref 11.2–14.5)
WBC: 6.1 10*3/uL (ref 3.9–10.3)
lymph#: 1.4 10*3/uL (ref 0.9–3.3)

## 2014-08-11 MED ORDER — TAMOXIFEN CITRATE 20 MG PO TABS: 20.0000 mg | ORAL_TABLET | Freq: Every day | ORAL | Status: DC

## 2014-08-11 NOTE — Progress Notes (Signed)
ID: [name] OB: [birth year]  MR#: [id number]  TWS#:[id number]  PCP: Brianna Shelling, MD GYN:  [name] SU: [name] OTHER MD: [name], [name], [name]  CHIEF COMPLAINT: Left invasive ductal breast Torres  CURRENT THERAPY: taxomifen 37m PO daily  BREAST Torres HISTORY: [name] routine mammography/tomography 11/28/2012 suggesting and abnormality in the left breast. Diagnostic left mammography and ultrasonography at the breast Center 12/07/2012 confirmed a density in the upper outer quadrant of the left breast, which was not palpable by exam. Ultrasound showed an irregular hypoechoic lesion in the left breast measuring 1.5 cm. The left axilla was sonographically benign.  Biopsy of the left breast mass in question 12/12/2012 showed an invasive ductal carcinoma, grade 2, estrogen and progesterone receptor positive, HER-2 not amplified, with an MIB-1 of approximately 10%. Breast MRI 12/18/2012 measured the left breast mass at 2.0 cm. There were no other masses of concern, and no suspicious internal mammary or axillary lymph nodes identified.  The patient's subsequent history is as detailed below.  INTERVAL HISTORY: [name]. She has been on tamoxifen since November 2014 and is tolerating it well. She never started on the gabapentin as her hot flashes actually got better on their own. She has some vaginal wetness as well, and her joint pain in her knees and ankles is not a severe as she described it at her last visit. The interval history is otherwise unremarkable.   REVIEW OF SYSTEMS: [name]. For the past few weeks she has experienced some burning with urination, but denies urgency or frequency, and this is not an every day occurrence. She has a history of irregular bowel syndrome and tries to increase the fiber in her diet to avoid constipation. She is eating and drinking  well. She denies shortness of breath, chest pain, cough, palpitations, or fatigue. She has no headaches, dizziness, or vision changes. She denies unexplained weight loss, night sweats, bruising, or bleeding. A detailed review of systems is otherwise negative.  PAST MEDICAL HISTORY: Past Medical History  Diagnosis Date  . PONV (postoperative nausea and Torres)   . Carotid artery stenosis     hx-dr dohmier monitors  . Wears glasses   . Seasonal allergies   . Breast Torres 12/12/12 bx    left breast  . Radiation 02/18/13-04/04/13    left breast  Bilateral carotid artery stenosis, status post dilatation  PAST SURGICAL HISTORY: Past Surgical History  Procedure Laterality Date  . Back surgery    . Wisdom tooth extraction    . Nasal polyp surgery    . Breast lumpectomy with needle localization and axillary sentinel lymph node bx Left 12/31/2012    Procedure: LEFT BREAST LUMPECTOMY WITH NEEDLE LOCALIZATION AND LEFT AXILLARY SENTINEL LYMPH NODE BIOPSY;  Surgeon: Brianna Medal MD;  [facility]  Service: General;  Laterality: Left;  . Colonoscopy  2007   removal of a nasal polyp  FAMILY HISTORY Family History  Problem Relation Age of Onset  . Uterine Torres Mother   . Brain Torres Paternal Grandfather   . Breast Torres Cousin   . Melanoma Daughter    the patient's father died from a stroke at age 59 The patient's mother is living, currently age 59 She was diagnosed with Torres of the uterus at age 59 The patient had one grandfather diagnosed with Torres of the brain. The patient has one maternal cousin diagnosed with  breast Torres in her 46s. The patient herself has 3 brothers and 3 sisters, none with Torres. The patient's daughter was diagnosed with melanoma in situ at the age of 11. She also has a history of DVT. There is no history of ovarian Torres in the family.  GYNECOLOGIC HISTORY:  Menarche age 39, first live birth age 48, the patient is [place] P3. Her  periods are now very irregular, she just had one 12/16/2012, the one prior to that was 03/18/2012.  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 Her husband [name], works for [employer] in [place] practically live in [place] Daughter [name] lives in [place] where she works as an a Corporate investment banker. Son [name]") lives in [place] where he works as an Physiological scientist. [name]") is in high school and lives with the patient at home. There are no grandchildren. The patient attends a local [employer].    ADVANCED DIRECTIVES: Not in place   HEALTH MAINTENANCE: History  Substance Use Topics  . Smoking status: Never Smoker   . Smokeless tobacco: Never Used  . Alcohol Use: Yes     Comment: rare     Colonoscopy: 2007/ [facility]  PAP: June 2015  Bone density: Never  Lipid panel:  No Known Allergies  Current Outpatient Prescriptions  Medication Sig Dispense Refill  . aspirin 325 MG tablet Take 325 mg by mouth daily.    Marland Kitchen atorvastatin (LIPITOR) 10 MG tablet Take 10 mg by mouth daily.    . Calcium Carbonate Antacid (TUMS PO) Take 1 tablet by mouth daily as needed. Pt chews 1 -2 tablet as needed for heartburn.    . Calcium Carbonate-Vitamin D (CALCIUM + D PO) Take by mouth daily.    . citalopram (CELEXA) 10 MG tablet Take 10 mg by mouth daily.    . Multiple Vitamins-Minerals (CENTRUM SILVER ADULT 50+ PO) Take by mouth.    . tamoxifen (NOLVADEX) 20 MG tablet TAKE 1 TABLET BY MOUTH DAILY 30 tablet 1  . Fluticasone Propionate (FLONASE NA) Place into the nose.    . gabapentin (NEURONTIN) 300 MG capsule Take 1 capsule (300 mg total) by mouth at bedtime. (Patient not taking: Reported on 08/11/2014) 90 capsule 3  . Loratadine (CLARITIN) 10 MG CAPS Take 10 mg by mouth as needed.    . non-metallic deodorant Jethro Poling) MISC Apply 1 application topically daily as needed. Apply after rad txs daily     No current facility-administered medications for this  visit.    OBJECTIVE: Middle-aged white woman who appears stated age 59 Vitals:   08/11/14 1329  BP: 158/82  Pulse: 97  Temp: 98.1 F (36.7 C)  Resp: 18     Body mass index is 30.42 kg/(m^2).    ECOG FS: 0   Sclerae unicteric, pupils equal and reactive Oropharynx clear and moist-- no thrush No cervical or supraclavicular adenopathy Lungs no rales or rhonchi Heart regular rate and rhythm Abd soft, nontender, positive bowel sounds MSK no focal spinal tenderness, no upper extremity lymphedema Neuro: nonfocal, well oriented, appropriate affect Breasts:  left breast status post lumpectomy and radiation. No evidence of local recurrence. Left axilla benign. Right breast unremarkable.   LAB RESULTS:  CMP     Component Value Date/Time   NA 143 08/11/2014 1307   K 3.8 08/11/2014 1307   CL 107 12/19/2012 0824   CO2 26 08/11/2014 1307   GLUCOSE 119 08/11/2014 1307   GLUCOSE 78 12/19/2012  0824   BUN 14.5 08/11/2014 1307   CREATININE 0.8 08/11/2014 1307   CALCIUM 9.1 08/11/2014 1307   PROT 6.9 08/11/2014 1307   ALBUMIN 3.9 08/11/2014 1307   AST 34 08/11/2014 1307   ALT 47 08/11/2014 1307   ALKPHOS 60 08/11/2014 1307   BILITOT 0.32 08/11/2014 1307    I No results found for: SPEP  Lab Results  Component Value Date   WBC 6.1 08/11/2014   NEUTROABS 4.1 08/11/2014   HGB 14.1 08/11/2014   HCT 43.7 08/11/2014   MCV 89.0 08/11/2014   PLT 190 08/11/2014      Chemistry      Component Value Date/Time   NA 143 08/11/2014 1307   K 3.8 08/11/2014 1307   CL 107 12/19/2012 0824   CO2 26 08/11/2014 1307   BUN 14.5 08/11/2014 1307   CREATININE 0.8 08/11/2014 1307      Component Value Date/Time   CALCIUM 9.1 08/11/2014 1307   ALKPHOS 60 08/11/2014 1307   AST 34 08/11/2014 1307   ALT 47 08/11/2014 1307   BILITOT 0.32 08/11/2014 1307       No results found for: LABCA2  No components found for: LABCA125  No results for input(s): INR in the last 168  hours.  Urinalysis No results found for: COLORURINE  STUDIES: Most recent mammogram on 11/29/2013 was unremarkable   ASSESSMENT: 59 y.o. [place] woman status post left breast biopsy 12/12/2012 for a clinical T1c N0, stage IA invasive ductal carcinoma, grade 2, estrogen and progesterone receptor positive, with no HER-2 amplification, and an MIB-1 of approximately 10%.  (1) left lumpectomy and sentinel lymph node sampling 12/31/2012 showed a pT1c pN0(i+), stage IIA invasive ductal carcinoma, grade 2, with repeat HER-2 again negative  (2) Oncotype recurrence score of 13 predicts a cane year risk of distant recurrence of 8% if the patient's only systemic treatment is tamoxifen for 5 years  (3) adjuvant radiation completed 04/04/2013  (4) started tamoxifen November 2014  PLAN: Braidyn looks and feels well today. The labs were reviewed in detail and were entirely normal. I have placed orders for a urinalysis and culture to be performed today to investigate a UTI as the source of her irregular dysuria. Otherwise, she is tolerating the tamoxifen well and the plan is to continue until at least this fall before we consider switching to an aromatase inhibitor.   I have placed orders for a bone density scan to be performed along with her next mammogram which is due in May. She will return to this office in August to discuss the results and decide if she will stay on tamoxifen for 10 years or switch to an aromatase inhibitor for 2 years. She understands and agrees with this plan. She knows the goal of treatment in her case is cure. She has been encouraged to call with any issues that might arise before her next visit here.  Marcelino Duster, NP

## 2014-08-11 NOTE — Telephone Encounter (Signed)
per pof to sch pt appt-cld to sch pt DEXA& mamma-gave pt copy of sch

## 2014-08-13 ENCOUNTER — Ambulatory Visit: Payer: 59 | Admitting: Nurse Practitioner

## 2014-08-13 ENCOUNTER — Other Ambulatory Visit: Payer: 59

## 2014-08-14 LAB — URINE CULTURE

## 2014-08-19 ENCOUNTER — Encounter: Payer: Self-pay | Admitting: Nurse Practitioner

## 2014-08-19 ENCOUNTER — Telehealth: Payer: Self-pay | Admitting: *Deleted

## 2014-08-19 NOTE — Telephone Encounter (Signed)
Called pt to inform her of the results of her urinalysis. Explained the results and asked pt if she was still having any symptoms at all, particularly, frequency and burning with voiding. Pt said she was not having any symptoms and declined any use of antibiotics that was offered. I did encourage pt to increase her fluid intake and she agreed. Message to be forwarded to Christeen Douglas

## 2014-08-20 ENCOUNTER — Other Ambulatory Visit: Payer: 59

## 2014-08-20 ENCOUNTER — Ambulatory Visit: Payer: 59 | Admitting: Nurse Practitioner

## 2014-11-03 ENCOUNTER — Other Ambulatory Visit: Payer: Self-pay | Admitting: Obstetrics & Gynecology

## 2014-11-04 ENCOUNTER — Telehealth: Payer: Self-pay | Admitting: Oncology

## 2014-11-04 NOTE — Telephone Encounter (Signed)
Returned Advertising account executive. Left message to confirm appointment rescheduled from 08/18 to 08/31. Mailed calendar.

## 2014-12-02 ENCOUNTER — Ambulatory Visit
Admission: RE | Admit: 2014-12-02 | Discharge: 2014-12-02 | Disposition: A | Discharge status: Home / Self Care | Payer: 59 | Source: Ambulatory Visit | Attending: Nurse Practitioner | Admitting: Nurse Practitioner

## 2014-12-02 ENCOUNTER — Other Ambulatory Visit: Payer: Self-pay | Admitting: Nurse Practitioner

## 2014-12-02 DIAGNOSIS — C50412 Malignant neoplasm of upper-outer quadrant of left female breast: Secondary | ICD-10-CM

## 2014-12-11 ENCOUNTER — Telehealth: Payer: Self-pay

## 2014-12-11 NOTE — Telephone Encounter (Signed)
Bone Density Results rcvd from [facility] 12/02/14.  Reviewed by Dr Jana Hakim.  Sent to scan.

## 2015-02-03 ENCOUNTER — Other Ambulatory Visit: Payer: Self-pay | Admitting: Obstetrics & Gynecology

## 2015-02-04 LAB — CYTOLOGY - PAP

## 2015-02-19 ENCOUNTER — Ambulatory Visit: Payer: 59 | Admitting: Oncology

## 2015-02-19 ENCOUNTER — Other Ambulatory Visit: Payer: 59

## 2015-03-04 ENCOUNTER — Ambulatory Visit (HOSPITAL_BASED_OUTPATIENT_CLINIC_OR_DEPARTMENT_OTHER): Payer: 59 | Admitting: Oncology

## 2015-03-04 ENCOUNTER — Telehealth: Payer: Self-pay | Admitting: Oncology

## 2015-03-04 ENCOUNTER — Other Ambulatory Visit: Payer: Self-pay | Admitting: *Deleted

## 2015-03-04 ENCOUNTER — Other Ambulatory Visit (HOSPITAL_BASED_OUTPATIENT_CLINIC_OR_DEPARTMENT_OTHER): Payer: 59

## 2015-03-04 VITALS — BP 149/89 | HR 98 | Temp 98.3°F | Resp 18 | Ht 64.0 in | Wt 168.0 lb

## 2015-03-04 DIAGNOSIS — C50412 Malignant neoplasm of upper-outer quadrant of left female breast: Secondary | ICD-10-CM

## 2015-03-04 DIAGNOSIS — Z853 Personal history of malignant neoplasm of breast: Secondary | ICD-10-CM

## 2015-03-04 DIAGNOSIS — Z79810 Long term (current) use of selective estrogen receptor modulators (SERMs): Secondary | ICD-10-CM

## 2015-03-04 LAB — CBC WITH DIFFERENTIAL/PLATELET
BASO%: 1.9 % (ref 0.0–2.0)
Basophils Absolute: 0.1 10*3/uL (ref 0.0–0.1)
EOS%: 1 % (ref 0.0–7.0)
Eosinophils Absolute: 0.1 10*3/uL (ref 0.0–0.5)
HCT: 40.9 % (ref 34.8–46.6)
HGB: 13.5 g/dL (ref 11.6–15.9)
LYMPH#: 1.6 10*3/uL (ref 0.9–3.3)
LYMPH%: 29.8 % (ref 14.0–49.7)
MCH: 29.5 pg (ref 25.1–34.0)
MCHC: 33 g/dL (ref 31.5–36.0)
MCV: 89.3 fL (ref 79.5–101.0)
MONO#: 0.4 10*3/uL (ref 0.1–0.9)
MONO%: 7.4 % (ref 0.0–14.0)
NEUT#: 3.2 10*3/uL (ref 1.5–6.5)
NEUT%: 59.9 % (ref 38.4–76.8)
PLATELETS: 189 10*3/uL (ref 145–400)
RBC: 4.58 10*6/uL (ref 3.70–5.45)
RDW: 12.8 % (ref 11.2–14.5)
WBC: 5.3 10*3/uL (ref 3.9–10.3)

## 2015-03-04 LAB — COMPREHENSIVE METABOLIC PANEL (CC13)
ALT: 36 U/L (ref 0–55)
ANION GAP: 7 meq/L (ref 3–11)
AST: 27 U/L (ref 5–34)
Albumin: 3.8 g/dL (ref 3.5–5.0)
Alkaline Phosphatase: 56 U/L (ref 40–150)
BILIRUBIN TOTAL: 0.33 mg/dL (ref 0.20–1.20)
BUN: 15.8 mg/dL (ref 7.0–26.0)
CO2: 28 mEq/L (ref 22–29)
CREATININE: 0.8 mg/dL (ref 0.6–1.1)
Calcium: 9.5 mg/dL (ref 8.4–10.4)
Chloride: 108 mEq/L (ref 98–109)
EGFR: 78 mL/min/{1.73_m2} — ABNORMAL LOW (ref >90)
GLUCOSE: 127 mg/dL (ref 70–140)
Potassium: 3.9 mEq/L (ref 3.5–5.1)
Sodium: 143 mEq/L (ref 136–145)
Total Protein: 6.6 g/dL (ref 6.4–8.3)

## 2015-03-04 MED ORDER — TAMOXIFEN CITRATE 20 MG PO TABS
20.0000 mg | ORAL_TABLET | Freq: Every day | ORAL | Status: DC
Start: 2015-03-04 — End: 2016-03-25

## 2015-03-04 NOTE — Telephone Encounter (Signed)
Gave avs & calendar for August 2017. Will see Dr.Magrinat in 2years

## 2015-03-04 NOTE — Progress Notes (Signed)
ID: Otilio Connors OB: 07/20/1955  MR#: [id number]  CSN#:[id number]  PCP: Irven Shelling, MD GYN:  Vania Rea SU: Alphonsa Overall OTHER MD: Thea Silversmith, Vernice Jefferson, Asencion Partridge Dohmeier  CHIEF COMPLAINT: Left invasive ductal breast cancer  CURRENT THERAPY: taxomifen 37m PO daily  BREAST CANCER HISTORY: From the earlier summary note: Bular had routine mammography/tomography 11/28/2012 suggesting and abnormality in the left breast. Diagnostic left mammography and ultrasonography at the breast Center 12/07/2012 confirmed a density in the upper outer quadrant of the left breast, which was not palpable by exam. Ultrasound showed an irregular hypoechoic lesion in the left breast measuring 1.5 cm. The left axilla was sonographically benign.  Biopsy of the left breast mass in question 12/12/2012 showed an invasive ductal carcinoma, grade 2, estrogen and progesterone receptor positive, HER-2 not amplified, with an MIB-1 of approximately 10%. Breast MRI 12/18/2012 measured the left breast mass at 2.0 cm. There were no other masses of concern, and no suspicious internal mammary or axillary lymph nodes identified.  The patient's subsequent history is as detailed below.  INTERVAL HISTORY: JTasheemareturns today for follow up of her left breast cancer. She continues on tamoxifen, with very good tolerance. Hot flashes are they're still, but better. She obtains a drug at $1 per month. Also since her last visit here she underwent endometrial curettage under Dr. WAscencion Dike The pathology was benign (SAA [phone]  2 weeks ago her mother died after 9 days in the hospital with MRSA sepsis. It is not clear how she was infected. JLalithais grieving appropriately  REVIEW OF SYSTEMS: JOpal Sidlesdoes have some ringing in her ears and sinus problems. She has rare problems with palpitations. She has stress urinary incontinence. Aside from these issues a detailed review of systems today was noncontributory  PAST MEDICAL HISTORY: Past  Medical History  Diagnosis Date  . PONV (postoperative nausea and vomiting)   . Carotid artery stenosis     hx-dr dohmier monitors  . Wears glasses   . Seasonal allergies   . Breast cancer 12/12/12 bx    left breast  . Radiation 02/18/13-04/04/13    left breast  Bilateral carotid artery stenosis, status post dilatation  PAST SURGICAL HISTORY: Past Surgical History  Procedure Laterality Date  . Back surgery    . Wisdom tooth extraction    . Nasal polyp surgery    . Breast lumpectomy with needle localization and axillary sentinel lymph node bx Left 12/31/2012    Procedure: LEFT BREAST LUMPECTOMY WITH NEEDLE LOCALIZATION AND LEFT AXILLARY SENTINEL LYMPH NODE BIOPSY;  Surgeon: DShann Medal MD;  Location[facility]  Service: General;  Laterality: Left;  . Colonoscopy  2007   removal of a nasal polyp  FAMILY HISTORY Family History  Problem Relation Age of Onset  . Uterine cancer Mother   . Brain cancer Paternal Grandfather   . Breast cancer Cousin   . Melanoma Daughter    the patient's father died from a stroke at age 59 The patient's mother died from a MRSA infection A08-21-2016 She was diagnosed with cancer of the uterus at age 59 The patient had one grandfather diagnosed with cancer of the brain. The patient has one maternal cousin diagnosed with breast cancer in her 667s The patient herself has 3 brothers and 3 sisters, none with cancer. The patient's daughter was diagnosed with melanoma in situ at the age of 230 She also has a history of DVT. There is no history of ovarian cancer in the family.  GYNECOLOGIC HISTORY:  Menarche age 69, first live birth age 56, the patient is [place] P3. Her periods are now very irregular, she just had one 12/16/2012, the one prior to that was 03/18/2012.  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 Her husband Legrand Como "Ronalee Belts" [place], works for [employer] in [place] practically live in [place]"). Daughter Museum/gallery conservator lives in  [place] where she works as an a Corporate investment banker. Son Levora Angel") lives in [place] where he works as an Physiological scientist. Magalia") is in college and lives with the patient in the summer. There are no grandchildren. The patient attends a local [employer].    ADVANCED DIRECTIVES: Not in place   HEALTH MAINTENANCE: Social History  Substance Use Topics  . Smoking status: Never Smoker   . Smokeless tobacco: Never Used  . Alcohol Use: Yes     Comment: rare     Colonoscopy: 2007/ [facility]  PAP: June 2015  Bone density: May 2016/ normal  Lipid panel:  No Known Allergies  Current Outpatient Prescriptions  Medication Sig Dispense Refill  . aspirin 325 MG tablet Take 325 mg by mouth daily.    Marland Kitchen atorvastatin (LIPITOR) 10 MG tablet Take 10 mg by mouth daily.    . Calcium Carbonate Antacid (TUMS PO) Take 1 tablet by mouth daily as needed. Pt chews 1 -2 tablet as needed for heartburn.    . Calcium Carbonate-Vitamin D (CALCIUM + D PO) Take by mouth daily.    . citalopram (CELEXA) 10 MG tablet Take 10 mg by mouth daily.    . Fluticasone Propionate (FLONASE NA) Place into the nose.    . gabapentin (NEURONTIN) 300 MG capsule Take 1 capsule (300 mg total) by mouth at bedtime. (Patient not taking: Reported on 08/11/2014) 90 capsule 3  . Loratadine (CLARITIN) 10 MG CAPS Take 10 mg by mouth as needed.    . Multiple Vitamins-Minerals (CENTRUM SILVER ADULT 50+ PO) Take by mouth.    . non-metallic deodorant Jethro Poling) MISC Apply 1 application topically daily as needed. Apply after rad txs daily    . tamoxifen (NOLVADEX) 20 MG tablet Take 1 tablet (20 mg total) by mouth daily. 90 tablet 2   No current facility-administered medications for this visit.    OBJECTIVE: Middle-aged white woman who appears well Filed Vitals:   03/04/15 1339  BP: 149/89  Pulse: 98  Temp: 98.3 F (36.8 C)  Resp: 18     Body mass index is 28.82 kg/(m^2).    ECOG FS: 0   Sclerae unicteric, EOMs  intact Oropharynx clear, dentition in good repair No cervical or supraclavicular adenopathy Lungs no rales or rhonchi Heart regular rate and rhythm Abd soft, nontender, positive bowel sounds MSK no focal spinal tenderness, no upper extremity lymphedema Neuro: nonfocal, well oriented, appropriate affect Breasts: The right breast is unremarkable. The left breast is status post lumpectomy and radiation. There is no evidence of local recurrence. The left axilla is benign.   LAB RESULTS:  CMP     Component Value Date/Time   NA 143 03/04/2015 1322   K 3.9 03/04/2015 1322   CL 107 12/19/2012 0824   CO2 28 03/04/2015 1322   GLUCOSE 127 03/04/2015 1322   GLUCOSE 78 12/19/2012 0824   BUN 15.8 03/04/2015 1322   CREATININE 0.8 03/04/2015 1322   CALCIUM 9.5 03/04/2015 1322   PROT 6.6 03/04/2015 1322   ALBUMIN 3.8 03/04/2015 1322   AST 27 03/04/2015 1322  ALT 36 03/04/2015 1322   ALKPHOS 56 03/04/2015 1322   BILITOT 0.33 03/04/2015 1322    I No results found for: SPEP  Lab Results  Component Value Date   WBC 5.3 03/04/2015   NEUTROABS 3.2 03/04/2015   HGB 13.5 03/04/2015   HCT 40.9 03/04/2015   MCV 89.3 03/04/2015   PLT 189 03/04/2015      Chemistry      Component Value Date/Time   NA 143 03/04/2015 1322   K 3.9 03/04/2015 1322   CL 107 12/19/2012 0824   CO2 28 03/04/2015 1322   BUN 15.8 03/04/2015 1322   CREATININE 0.8 03/04/2015 1322      Component Value Date/Time   CALCIUM 9.5 03/04/2015 1322   ALKPHOS 56 03/04/2015 1322   AST 27 03/04/2015 1322   ALT 36 03/04/2015 1322   BILITOT 0.33 03/04/2015 1322       No results found for: LABCA2  No components found for: LABCA125  No results for input(s): INR in the last 168 hours.  Urinalysis No results found for: COLORURINE  STUDIES: Most recent mammogram on 11/29/2013 was unremarkable  CLINICAL DATA: 60 year old postmenopausal Caucasian female. No personal fragility fracture.  EXAM: DUAL X-RAY  ABSORPTIOMETRY (DXA) FOR BONE MINERAL DENSITY  FINDINGS: AP LUMBAR SPINE L1, L4  Bone Mineral Density (BMD): 0.997 g/cm2  Young Adult T-Score: -0.4  Z-Score: 0.9  Left FEMUR neck  Bone Mineral Density (BMD): 0.784 g/cm2  Young Adult T-Score: -0.6  Z-Score: 0.6  ASSESSMENT: Patient's diagnostic category is normal by WHO Criteria.  FRACTURE RISK: Not increased  FRAX: World Health Organization FRAX assessment of absolute fracture risk is not calculated for this patient because the patient has normal bone mineral density.  COMPARISON: None.  ASSESSMENT: 59 y.o. [place], [place] woman status post left breast biopsy 12/12/2012 for a clinical T1c N0, stage IA invasive ductal carcinoma, grade 2, estrogen and progesterone receptor positive, with no HER-2 amplification, and an MIB-1 of approximately 10%.  (1) left lumpectomy and sentinel lymph node sampling 12/31/2012 showed a pT1c pN0(i+), stage IIA invasive ductal carcinoma, grade 2, with repeat HER-2 again negative  (2) Oncotype recurrence score of 13 predicts a 10 year risk of distant recurrence of 8% if the patient's only systemic treatment is tamoxifen for 5 years  (3) adjuvant radiation completed 04/04/2013  (4) started tamoxifen November 2014  PLAN: Neala is doing fine as far as tamoxifen is concerned and she has a good enough prognosis that if she took that for 5 years she would have a better than 90% chance of this cancer not coming back outside the breast within 10 years.  If she switched to anastrozole she would improve on that by about 3%. She also probably would not have to worry so much about endometrial issues (recall her mother did have a history of endometrial cancer). On the other hand bone density issues might become more of a problem.  It is very favorable that she has a normal bone density at this point.  Today she could not quite make up her mind. If she decides to switch to anastrozole she  will let us know. Otherwise the plan is to continue tamoxifen and see her on a yearly basis and she will see our breast survivorship nurse practitioner next year for review of systems physical exam and lab review, and then see me again a year later.  She knows to call for any problems that may develop before her next visit here.  Chauncey Cruel, MD

## 2015-03-11 ENCOUNTER — Other Ambulatory Visit: Payer: Self-pay | Admitting: Oncology

## 2015-03-11 DIAGNOSIS — C50919 Malignant neoplasm of unspecified site of unspecified female breast: Secondary | ICD-10-CM

## 2015-12-01 ENCOUNTER — Other Ambulatory Visit: Payer: Self-pay | Admitting: Oncology

## 2015-12-01 DIAGNOSIS — Z853 Personal history of malignant neoplasm of breast: Secondary | ICD-10-CM

## 2015-12-11 ENCOUNTER — Ambulatory Visit
Admission: RE | Admit: 2015-12-11 | Discharge: 2015-12-11 | Disposition: A | Discharge status: Home / Self Care | Payer: 59 | Source: Ambulatory Visit | Attending: Oncology | Admitting: Oncology

## 2015-12-11 DIAGNOSIS — Z853 Personal history of malignant neoplasm of breast: Secondary | ICD-10-CM

## 2016-03-02 ENCOUNTER — Other Ambulatory Visit: Payer: Self-pay | Admitting: *Deleted

## 2016-03-02 ENCOUNTER — Other Ambulatory Visit (HOSPITAL_BASED_OUTPATIENT_CLINIC_OR_DEPARTMENT_OTHER): Payer: 59

## 2016-03-02 ENCOUNTER — Ambulatory Visit (HOSPITAL_BASED_OUTPATIENT_CLINIC_OR_DEPARTMENT_OTHER): Payer: 59 | Admitting: Adult Health

## 2016-03-02 ENCOUNTER — Encounter: Payer: Self-pay | Admitting: Adult Health

## 2016-03-02 ENCOUNTER — Telehealth: Payer: Self-pay | Admitting: Oncology

## 2016-03-02 VITALS — BP 160/93 | HR 75 | Temp 98.1°F | Resp 18 | Ht 64.0 in | Wt 168.8 lb

## 2016-03-02 DIAGNOSIS — Z853 Personal history of malignant neoplasm of breast: Secondary | ICD-10-CM | POA: Diagnosis not present

## 2016-03-02 DIAGNOSIS — K30 Functional dyspepsia: Secondary | ICD-10-CM | POA: Diagnosis not present

## 2016-03-02 DIAGNOSIS — C50412 Malignant neoplasm of upper-outer quadrant of left female breast: Secondary | ICD-10-CM

## 2016-03-02 LAB — COMPREHENSIVE METABOLIC PANEL
ALBUMIN: 3.6 g/dL (ref 3.5–5.0)
ALK PHOS: 54 U/L (ref 40–150)
ALT: 27 U/L (ref 0–55)
AST: 23 U/L (ref 5–34)
Anion Gap: 8 mEq/L (ref 3–11)
BUN: 15.7 mg/dL (ref 7.0–26.0)
CALCIUM: 8.9 mg/dL (ref 8.4–10.4)
CO2: 26 mEq/L (ref 22–29)
Chloride: 109 mEq/L (ref 98–109)
Creatinine: 0.8 mg/dL (ref 0.6–1.1)
EGFR: 87 mL/min/{1.73_m2} — ABNORMAL LOW (ref >90)
GLUCOSE: 88 mg/dL (ref 70–140)
POTASSIUM: 4.1 meq/L (ref 3.5–5.1)
SODIUM: 143 meq/L (ref 136–145)
TOTAL PROTEIN: 6.6 g/dL (ref 6.4–8.3)

## 2016-03-02 LAB — CBC WITH DIFFERENTIAL/PLATELET
BASO%: 1.3 % (ref 0.0–2.0)
BASOS ABS: 0.1 10*3/uL (ref 0.0–0.1)
EOS%: 0.7 % (ref 0.0–7.0)
Eosinophils Absolute: 0 10*3/uL (ref 0.0–0.5)
HEMATOCRIT: 40.5 % (ref 34.8–46.6)
HEMOGLOBIN: 13.4 g/dL (ref 11.6–15.9)
LYMPH#: 1.6 10*3/uL (ref 0.9–3.3)
LYMPH%: 29.4 % (ref 14.0–49.7)
MCH: 29 pg (ref 25.1–34.0)
MCHC: 33 g/dL (ref 31.5–36.0)
MCV: 88 fL (ref 79.5–101.0)
MONO#: 0.4 10*3/uL (ref 0.1–0.9)
MONO%: 7.7 % (ref 0.0–14.0)
NEUT%: 60.9 % (ref 38.4–76.8)
NEUTROS ABS: 3.2 10*3/uL (ref 1.5–6.5)
Platelets: 185 10*3/uL (ref 145–400)
RBC: 4.61 10*6/uL (ref 3.70–5.45)
RDW: 12.9 % (ref 11.2–14.5)
WBC: 5.3 10*3/uL (ref 3.9–10.3)

## 2016-03-02 NOTE — Telephone Encounter (Signed)
appt made and avs printed °

## 2016-03-02 NOTE — Progress Notes (Signed)
CLINIC:  Survivorship   REASON FOR VISIT:  Routine follow-up for history of breast cancer.   BRIEF ONCOLOGIC HISTORY:  (from Dr. Virgie Dad last visit on 03/04/15)    INTERVAL HISTORY:  [name] presents to the [facility] today for routine follow-up for her history of breast cancer.  Overall, she reports feeling very well.  She continues on the tamoxifen with good tolerance. She has some hot flashes and night sweats, but they have not worsened in severity and are stable.  She reports chronic tinnitus and heart palpitations; she sees her PCP regularly.  She has some joint pains, particularly in there knees, which has also been a longstanding issue and not worsened since her last visit.  She continues to walk regularly, about 3-4 times/week and about 2 miles each episode.    She has some GI issues at times, including diarrhea, excessive belching, and feeling bloated, then other days "I feel completely normal"; she thinks she may have irritable bowel disease. She has chronic heartburn as well; does not take any antiacids regularly.  She has no new breast concerns.  She feels a pulling sensation in her left axilla that is uncomfortable at time, "but I think that's just related to my breast surgery."    REVIEW OF SYSTEMS:  Review of Systems  Constitutional: Negative.   HENT: Positive for tinnitus.   Eyes: Negative.   Cardiovascular: Positive for palpitations.  Gastrointestinal: Positive for diarrhea and heartburn.  Genitourinary: Negative.   Musculoskeletal: Positive for joint pain.  Skin: Negative.   Neurological: Negative.   Endo/Heme/Allergies:       Occasional night sweats and hot flashes; they have not worsened in severity or frequency; "I think they may have actually gotten a little better."   Psychiatric/Behavioral: Negative.   GU: Denies vaginal bleeding, discharge, or dryness.  Breast: Denies any new nodularity, masses, tenderness, nipple changes, or nipple  discharge.    A 14-point review of systems was completed and was negative, except as noted above.    PAST MEDICAL/SURGICAL HISTORY:  Past Medical History:  Diagnosis Date  . Breast cancer 12/12/12 bx   left breast  . Carotid artery stenosis    hx-dr dohmier monitors  . PONV (postoperative nausea and vomiting)   . Radiation 02/18/13-04/04/13   left breast  . Seasonal allergies   . Wears glasses    Past Surgical History:  Procedure Laterality Date  . BACK SURGERY    . BREAST LUMPECTOMY WITH NEEDLE LOCALIZATION AND AXILLARY SENTINEL LYMPH NODE BX Left 12/31/2012   Procedure: LEFT BREAST LUMPECTOMY WITH NEEDLE LOCALIZATION AND LEFT AXILLARY SENTINEL LYMPH NODE BIOPSY;  Surgeon: Shann Medal, MD;  Location: [facility];  Service: General;  Laterality: Left;  . COLONOSCOPY  2007  . NASAL POLYP SURGERY    . WISDOM TOOTH EXTRACTION       ALLERGIES:  No Known Allergies   CURRENT MEDICATIONS:  Outpatient Encounter Prescriptions as of 03/02/2016  Medication Sig  . aspirin 81 MG chewable tablet Chew 81 mg by mouth daily.  Marland Kitchen atorvastatin (LIPITOR) 10 MG tablet Take 10 mg by mouth daily.  . Calcium Carbonate Antacid (TUMS PO) Take 1 tablet by mouth daily as needed. Pt chews 1 -2 tablet as needed for heartburn.  . Calcium Carbonate-Vitamin D (CALCIUM + D PO) Take by mouth daily.  . citalopram (CELEXA) 10 MG tablet Take 10 mg by mouth daily.  . Fluticasone Propionate (FLONASE NA) Place into the nose.  . Loratadine (  CLARITIN) 10 MG CAPS Take 10 mg by mouth as needed.  . Multiple Vitamins-Minerals (CENTRUM SILVER ADULT 50+ PO) Take by mouth.  . non-metallic deodorant Jethro Poling) MISC Apply 1 application topically daily as needed. Apply after rad txs daily  . tamoxifen (NOLVADEX) 20 MG tablet Take 1 tablet (20 mg total) by mouth daily.  . [DISCONTINUED] aspirin 325 MG tablet Take 325 mg by mouth daily.   No facility-administered encounter medications on file as of 03/02/2016.       ONCOLOGIC FAMILY HISTORY:  Family History  Problem Relation Age of Onset  . Uterine cancer Mother   . Brain cancer Paternal Grandfather   . Breast cancer Cousin   . Melanoma Daughter     GENETIC COUNSELING/TESTING: None.  SOCIAL HISTORY:  [name] is married and lives in [place], [place].  She has 3 children and now 2 grandchildren; her grandson was born in June.  Her granddaughter is 69 years old.   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  She denies any current or history of tobacco, alcohol, or illicit drug use.     PHYSICAL EXAMINATION:  Vital Signs: Vitals:   03/02/16 1154  BP: (!) 160/93  Pulse: 75  Resp: 18  Temp: 98.1 F (36.7 C)   Filed Weights   03/02/16 1154  Weight: 168 lb 12.8 oz (76.6 kg)   General: Well-nourished, well-appearing female in no acute distress.  She is unaccompanied today.   HEENT: Head is normocephalic.  Pupils equal and reactive to light. Conjunctivae clear without exudate.  Sclerae anicteric. Oral mucosa is pink, moist.  Oropharynx is pink without lesions or erythema.  Lymph: No cervical, supraclavicular, or infraclavicular lymphadenopathy noted on palpation.  Cardiovascular: Regular rate and rhythm.Marland Kitchen Respiratory: Clear to auscultation bilaterally. Chest expansion symmetric; breathing non-labored.  Breast Exam:  -Left breast: No appreciable masses on palpation. No skin redness, thickening, or peau d'orange appearance; no nipple discharge; mild distortion in symmetry at previous lumpectomy site; tenderness to palpation at lumpectomy site, no palpable masses; healed scar without erythema or nodularity.  -Right breast: No appreciable masses on palpation. No skin redness, thickening, or peau d'orange appearance; no nipple retraction or nipple discharge.  -Axilla: No axillary adenopathy bilaterally.  GI: Abdomen soft and round; non-tender, non-distended. Bowel sounds normoactive. No hepatosplenomegaly.   GU: Deferred.  Neuro: No focal  deficits. Steady gait.  Psych: Mood and affect normal and appropriate for situation.  Extremities: No edema. Skin: Warm and dry.  LABORATORY DATA:  CBC    Component Value Date/Time   WBC 5.3 03/02/2016 1142   RBC 4.61 03/02/2016 1142   HGB 13.4 03/02/2016 1142   HCT 40.5 03/02/2016 1142   PLT 185 03/02/2016 1142   MCV 88.0 03/02/2016 1142   MCH 29.0 03/02/2016 1142   MCHC 33.0 03/02/2016 1142   RDW 12.9 03/02/2016 1142   LYMPHSABS 1.6 03/02/2016 1142   MONOABS 0.4 03/02/2016 1142   EOSABS 0.0 03/02/2016 1142   BASOSABS 0.1 03/02/2016 1142   CMP Latest Ref Rng & Units 03/02/2016 03/04/2015 08/11/2014  Glucose 70 - 140 mg/dl 88 127 119  BUN 7.0 - 26.0 mg/dL 15.7 15.8 14.5  Creatinine 0.6 - 1.1 mg/dL 0.8 0.8 0.8  Sodium 136 - 145 mEq/L 143 143 143  Potassium 3.5 - 5.1 mEq/L 4.1 3.9 3.8  Chloride 98 - 107 mEq/L - - -  CO2 22 - 29 mEq/L 26 28 26   Calcium 8.4 - 10.4 mg/dL 8.9 9.5 9.1  Total Protein  6.4 - 8.3 g/dL 6.6 6.6 6.9  Total Bilirubin 0.20 - 1.20 mg/dL <0.30 0.33 0.32  Alkaline Phos 40 - 150 U/L 54 56 60  AST 5 - 34 U/L 23 27 34  ALT 0 - 55 U/L 27 36 47    *Labs reviewed with patient in detail and are largely within normal limits. She was provided a copy of her lab work today as requested by pt.    DIAGNOSTIC IMAGING:  Most recent mammogram: 12/11/15     ASSESSMENT AND PLAN:  Ms.. Torres is a pleasant 60 y.o. female with history of Stage IIA left breast invasive ductal carcinoma, ER+/PR+/HER2-, diagnosed in 12/2012, treated with lumpectomy, adjuvant radiation therapy, and anti-estrogen therapy with Tamoxifen beginning in 05/2013.  She [facility] for surveillance and routine follow-up.   1. History of Stage IIA left breast cancer:  Ms. Novakovich is currently clinically and radiographically without evidence of disease or recurrence of breast cancer. She will follow-up with her medical oncologist, Dr. Jana Hakim, in 1 year with history and physical exam  per surveillance protocol.  Her annual diagnostic mammogram will be due in 12/2016 at the [facility]; orders placed today. She understands that she will receive diagnostic mammograms for at least 5-7 years after lumpectomy for continued surveillance.  She will maintain her endocrine therapy with Tamoxifen (see #2 below). I encouraged her to call me with any questions or concerns before her next visit to the cancer center and I would be happy to see her sooner, if needed.   2. Tamoxifen vs. Aromatase inhibitor discussion: Ms. Brugger discussed her options, as they were presented last year by Dr. Jana Hakim for her to switch from the Tamoxifen to an aromatase inhibitor, like Anastrazole.  We reviewed that switching to anastrazole would give her about a 3% risk reduction in recurrence and it would also remove her risk of endometrial changes that are associated with tamoxifen.  However, anastrazole can weaken bones, although her most recent DEXA scan in 11/2014 was normal.  After discussing the risks and benefits, Ms. Armijo would like to continue on the Tamoxifen.  The additional 3% risk reduction does not motivate her to switch medications when she is tolerating the tamoxifen pretty well.  She also is very concerned about her bone health and "I really don't like the idea of taking something that could weaken my bones."  She verbalized understanding of the continued small risk of endometrial hyperplasia or cancer with the use of tamoxifen.  She did have an endometrial biopsy in 11/2014 that was normal.  She understands that any post-menopausal vaginal bleeding is abnormal and should be evaluated by her gynecologist.  She understands these risks and benefits and will continue on her current Tamoxifen anti-estrogen therapy. She will complete 5 years of therapy in 05/2018.    3. GI concerns: Based on her reported symptoms, it sounds like she may have some irritable bowel disease.  I discussed the option of sending her to  see a GI specialist, but she deferred that at this time.  We also discussed that she could take some probiotics, which may help with her stools, gas, and heartburn.  I encouraged her to buy some OTC probiotics and try them for 2 weeks, with the understanding that when initial probiotic therapy is initiated there may be some GI distress as the gut gets used to the increased normal flora.  I also encouraged her to take antiacids as needed as well for heartburn (i.e.  Zantac or Prilosec).   Her last colonoscopy was about 9 years ago per patient.  She will try this and let us know if she would like a referral to a GI specialist in the future.     4. Bone health:  Given Ms. Krieger age and history of breast cancer, she is at risk for bone demineralization.  Her last DEXA scan was on 12/02/14 and was normal.  I will defer any future DEXA imaging to her medical oncologist or PCP, as clinically indicated.  In the meantime, I commended her on her walking regimen and encouraged her to keep up the good work.   Walking is a great weight-bearing exercise to continue to maintain bone strength.  I also  encouraged her to increase her consumption of foods rich in calcium, as well as increase her weight-bearing activities.  She was given education on specific food and activities to promote bone health.    Dispo:  -Annual diagnostic mammogram due in 12/2016; orders placed today.  -Return to cancer center to see Dr. Jana Hakim in 02/2017   A total of 30 minutes of face-to-face time was spent with this patient with greater than 50% of that time in counseling and care-coordination.   Mike Craze, NP Survivorship Program [facility] [phone]   Note: PRIMARY CARE PROVIDER Irven Shelling, [place] [phone]

## 2016-03-25 ENCOUNTER — Other Ambulatory Visit: Payer: Self-pay | Admitting: Oncology

## 2016-03-25 DIAGNOSIS — C50412 Malignant neoplasm of upper-outer quadrant of left female breast: Secondary | ICD-10-CM

## 2016-03-25 NOTE — Telephone Encounter (Signed)
Chart reviewed.

## 2016-07-24 ENCOUNTER — Telehealth: Payer: Commercial Indemnity | Admitting: Family

## 2016-07-24 ENCOUNTER — Other Ambulatory Visit: Payer: Self-pay | Admitting: Family

## 2016-07-24 DIAGNOSIS — J208 Acute bronchitis due to other specified organisms: Secondary | ICD-10-CM

## 2016-07-24 MED ORDER — BENZONATATE 100 MG PO CAPS: 100.0000 mg | ORAL_CAPSULE | Freq: Three times a day (TID) | ORAL | 0 refills | Status: DC | PRN

## 2016-07-24 MED ORDER — PREDNISONE 10 MG (21) PO TBPK: ORAL_TABLET | ORAL | 0 refills | Status: DC

## 2016-07-24 NOTE — Progress Notes (Signed)
We are sorry that you are not feeling well.  Here is how we plan to help!  Based on what you have shared with me it looks like you have upper respiratory tract inflammation that has resulted in a significant cough.  Inflammation and infection in the upper respiratory tract is commonly called bronchitis and has four common causes:  Allergies, Viral Infections, Acid Reflux and Bacterial Infections.  Allergies, viruses and acid reflux are treated by controlling symptoms or eliminating the cause. An example might be a cough caused by taking certain blood pressure medications. You stop the cough by changing the medication. Another example might be a cough caused by acid reflux. Controlling the reflux helps control the cough.  Based on your presentation I believe you most likely have A cough due to a virus.  This is called viral bronchitis and is best treated by rest, plenty of fluids and control of the cough.  You may use Ibuprofen or Tylenol as directed to help your symptoms.     In addition you may use A non-prescription cough medication called Robitussin DAC. Take 2 teaspoons every 8 hours or Delsym: take 2 teaspoons every 12 hours., A non-prescription cough medication called Mucinex DM: take 2 tablets every 12 hours. and A prescription cough medication called Tessalon Perles 100mg. You may take 1-2 capsules every 8 hours as needed for your cough.  Sterapred 10 mg dosepak  USE OF BRONCHODILATOR ("RESCUE") INHALERS: There is a risk from using your bronchodilator too frequently.  The risk is that over-reliance on a medication which only relaxes the muscles surrounding the breathing tubes can reduce the effectiveness of medications prescribed to reduce swelling and congestion of the tubes themselves.  Although you feel brief relief from the bronchodilator inhaler, your asthma may actually be worsening with the tubes becoming more swollen and filled with mucus.  This can delay other crucial treatments, such as  oral steroid medications. If you need to use a bronchodilator inhaler daily, several times per day, you should discuss this with your provider.  There are probably better treatments that could be used to keep your asthma under control.     HOME CARE . Only take medications as instructed by your medical team. . Complete the entire course of an antibiotic. . Drink plenty of fluids and get plenty of rest. . Avoid close contacts especially the very young and the elderly . Cover your mouth if you cough or cough into your sleeve. . Always remember to wash your hands . A steam or ultrasonic humidifier can help congestion.   GET HELP RIGHT AWAY IF: . You develop worsening fever. . You become short of breath . You cough up blood. . Your symptoms persist after you have completed your treatment plan MAKE SURE YOU   Understand these instructions.  Will watch your condition.  Will get help right away if you are not doing well or get worse.  Your e-visit answers were reviewed by a board certified advanced clinical practitioner to complete your personal care plan.  Depending on the condition, your plan could have included both over the counter or prescription medications. If there is a problem please reply  once you have received a response from your provider. Your safety is important to us.  If you have drug allergies check your prescription carefully.    You can use MyChart to ask questions about today's visit, request a non-urgent call back, or ask for a work or school excuse for 24 hours   related to this e-Visit. If it has been greater than 24 hours you will need to follow up with your provider, or enter a new e-Visit to address those concerns. You will get an e-mail in the next two days asking about your experience.  I hope that your e-visit has been valuable and will speed your recovery. Thank you for using e-visits.   

## 2016-12-14 ENCOUNTER — Ambulatory Visit
Admission: RE | Admit: 2016-12-14 | Discharge: 2016-12-14 | Disposition: A | Discharge status: Home / Self Care | Payer: Managed Care, Other (non HMO) | Source: Ambulatory Visit | Attending: Adult Health | Admitting: Adult Health

## 2016-12-14 DIAGNOSIS — C50412 Malignant neoplasm of upper-outer quadrant of left female breast: Secondary | ICD-10-CM

## 2016-12-14 HISTORY — DX: Personal history of irradiation: Z92.3

## 2017-02-28 ENCOUNTER — Other Ambulatory Visit: Payer: Self-pay

## 2017-02-28 DIAGNOSIS — C50412 Malignant neoplasm of upper-outer quadrant of left female breast: Secondary | ICD-10-CM

## 2017-03-01 ENCOUNTER — Ambulatory Visit (HOSPITAL_BASED_OUTPATIENT_CLINIC_OR_DEPARTMENT_OTHER): Payer: Managed Care, Other (non HMO) | Admitting: Oncology

## 2017-03-01 ENCOUNTER — Other Ambulatory Visit (HOSPITAL_BASED_OUTPATIENT_CLINIC_OR_DEPARTMENT_OTHER): Payer: Managed Care, Other (non HMO)

## 2017-03-01 VITALS — BP 164/95 | HR 104 | Temp 97.4°F | Resp 18 | Ht 64.0 in | Wt 169.9 lb

## 2017-03-01 DIAGNOSIS — Z853 Personal history of malignant neoplasm of breast: Secondary | ICD-10-CM

## 2017-03-01 DIAGNOSIS — Z79810 Long term (current) use of selective estrogen receptor modulators (SERMs): Secondary | ICD-10-CM

## 2017-03-01 DIAGNOSIS — Z170 Estrogen receptor positive status [ER+]: Secondary | ICD-10-CM

## 2017-03-01 DIAGNOSIS — C50412 Malignant neoplasm of upper-outer quadrant of left female breast: Secondary | ICD-10-CM

## 2017-03-01 LAB — CBC WITH DIFFERENTIAL/PLATELET
BASO%: 1.1 % (ref 0.0–2.0)
Basophils Absolute: 0.1 10*3/uL (ref 0.0–0.1)
EOS%: 0.5 % (ref 0.0–7.0)
Eosinophils Absolute: 0 10*3/uL (ref 0.0–0.5)
HCT: 41.3 % (ref 34.8–46.6)
HGB: 13.8 g/dL (ref 11.6–15.9)
LYMPH%: 25.5 % (ref 14.0–49.7)
MCH: 29.6 pg (ref 25.1–34.0)
MCHC: 33.3 g/dL (ref 31.5–36.0)
MCV: 88.8 fL (ref 79.5–101.0)
MONO#: 0.4 10*3/uL (ref 0.1–0.9)
MONO%: 5.8 % (ref 0.0–14.0)
NEUT%: 67.1 % (ref 38.4–76.8)
NEUTROS ABS: 4.6 10*3/uL (ref 1.5–6.5)
Platelets: 180 10*3/uL (ref 145–400)
RBC: 4.65 10*6/uL (ref 3.70–5.45)
RDW: 12.9 % (ref 11.2–14.5)
WBC: 6.8 10*3/uL (ref 3.9–10.3)
lymph#: 1.7 10*3/uL (ref 0.9–3.3)

## 2017-03-01 LAB — COMPREHENSIVE METABOLIC PANEL
ALT: 50 U/L (ref 0–55)
AST: 37 U/L — AB (ref 5–34)
Albumin: 3.6 g/dL (ref 3.5–5.0)
Alkaline Phosphatase: 58 U/L (ref 40–150)
Anion Gap: 5 mEq/L (ref 3–11)
BILIRUBIN TOTAL: 0.36 mg/dL (ref 0.20–1.20)
BUN: 15.2 mg/dL (ref 7.0–26.0)
CO2: 27 meq/L (ref 22–29)
Calcium: 9.5 mg/dL (ref 8.4–10.4)
Chloride: 109 mEq/L (ref 98–109)
Creatinine: 0.8 mg/dL (ref 0.6–1.1)
EGFR: 81 mL/min/{1.73_m2} — AB (ref >90)
GLUCOSE: 123 mg/dL (ref 70–140)
Potassium: 3.7 mEq/L (ref 3.5–5.1)
SODIUM: 141 meq/L (ref 136–145)
TOTAL PROTEIN: 6.7 g/dL (ref 6.4–8.3)

## 2017-03-01 MED ORDER — TAMOXIFEN CITRATE 20 MG PO TABS: 20.0000 mg | ORAL_TABLET | Freq: Every day | ORAL | 3 refills | Status: DC

## 2017-03-01 NOTE — Progress Notes (Signed)
ID: [name] OB: [birth year]  MR#: [id number]  CSN#:[id number]  PCP: Lavone Orn, MD GYN:  Vania Rea SU: Alphonsa Overall OTHER MD: Thea Silversmith, Vernice Jefferson, Asencion Partridge Dohmeier  CHIEF COMPLAINT: Estrogen receptor positive breast cancer  CURRENT THERAPY: taxomifen 84m PO daily  BREAST CANCER HISTORY: From the earlier summary note:  Evah had routine mammography/tomography 11/28/2012 suggesting and abnormality in the left breast. Diagnostic left mammography and ultrasonography at the [facility] Center 12/07/2012 confirmed a density in the upper outer quadrant of the left breast, which was not palpable by exam. Ultrasound showed an irregular hypoechoic lesion in the left breast measuring 1.5 cm. The left axilla was sonographically benign.  Biopsy of the left breast mass in question 12/12/2012 showed an invasive ductal carcinoma, grade 2, estrogen and progesterone receptor positive, HER-2 not amplified, with an MIB-1 of approximately 10%. Breast MRI 12/18/2012 measured the left breast mass at 2.0 cm. There were no other masses of concern, and no suspicious internal mammary or axillary lymph nodes identified.  The patient's subsequent history is as detailed below.  INTERVAL HISTORY: JMelisiareturns today for follow-up and treatment of her estrogen receptor positive breast cancer. She continues on tamoxifen with good tolerance. She has minimal hot flashes. Vaginal wetness is not an issue. She obtains a drug at a good price.  She had mammography at the [facility] in June which showed no evidence of disease.  REVIEW OF SYSTEMS: Lennette exercises by doing water aerobics for mornings a week. She's been having some knee problems and is seeing orthopedics for that. She did get some cortisone shots which have helped. They also may the hot flashes little hotter she says. She has mild sinus symptoms. Otherwise a detailed review of systems today was noncontributory  PAST MEDICAL HISTORY: Past Medical  History:  Diagnosis Date  . Breast cancer ([facility] 12/12/12 bx   left breast  . Carotid artery stenosis    hx-dr dohmier monitors  . Personal history of radiation therapy 2014  . PONV (postoperative nausea and vomiting)   . Radiation 02/18/13-04/04/13   left breast  . Seasonal allergies   . Wears glasses   Bilateral carotid artery stenosis, status post dilatation  PAST SURGICAL HISTORY: Past Surgical History:  Procedure Laterality Date  . BACK SURGERY    . BREAST LUMPECTOMY Left 12/31/2012  . BREAST LUMPECTOMY WITH NEEDLE LOCALIZATION AND AXILLARY SENTINEL LYMPH NODE BX Left 12/31/2012   Procedure: LEFT BREAST LUMPECTOMY WITH NEEDLE LOCALIZATION AND LEFT AXILLARY SENTINEL LYMPH NODE BIOPSY;  Surgeon: DShann Medal MD;  [facility]  Service: General;  Laterality: Left;  . COLONOSCOPY  2007  . NASAL POLYP SURGERY    . WISDOM TOOTH EXTRACTION     removal of a nasal polyp  FAMILY HISTORY Family History  Problem Relation Age of Onset  . Uterine cancer Mother   . Brain cancer Paternal Grandfather   . Breast cancer Cousin   . Melanoma Daughter   . Breast cancer Cousin    the patient's father died from a stroke at age 61 The patient's mother died from a MRSA infection A09/06/2015 She was diagnosed with cancer of the uterus at age 89 The patient had one grandfather diagnosed with cancer of the brain. The patient has one maternal cousin diagnosed with breast cancer in her 623s The patient herself has 3 brothers and 3 sisters, none with cancer. The patient's daughter was diagnosed with melanoma in situ at the age of 221 She also has  a history of DVT. There is no history of ovarian cancer in the family.  GYNECOLOGIC HISTORY:  Menarche age 73, first live birth age 43, the patient is [place] P3. Her periods are now very irregular, she just had one 12/16/2012, the one prior to that was 03/18/2012.  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 Her husband  Legrand Como "Ronalee Belts" [place], works for [employer] in [place] practically live in [place]"). Daughter Museum/gallery conservator lives in [place] where she works as an a Corporate investment banker. Son Levora Angel") lives in [place] where he works as an Physiological scientist. [place]") is in college and lives with the patient in the summer. There are no grandchildren. The patient attends a local [employer].    ADVANCED DIRECTIVES: Not in place   HEALTH MAINTENANCE: Social History  Substance Use Topics  . Smoking status: Never Smoker  . Smokeless tobacco: Never Used  . Alcohol use Yes     Comment: rare     Colonoscopy: 2007/ [facility]  PAP: June 2015  Bone density: May 2016/ normal  Lipid panel:  No Known Allergies  Current Outpatient Prescriptions  Medication Sig Dispense Refill  . aspirin 81 MG chewable tablet Chew 81 mg by mouth daily.    Marland Kitchen atorvastatin (LIPITOR) 10 MG tablet Take 10 mg by mouth daily.    . Calcium Carbonate Antacid (TUMS PO) Take 1 tablet by mouth daily as needed. Pt chews 1 -2 tablet as needed for heartburn.    . Calcium Carbonate-Vitamin D (CALCIUM + D PO) Take by mouth daily.    . citalopram (CELEXA) 10 MG tablet Take 10 mg by mouth daily.    . Fluticasone Propionate (FLONASE NA) Place into the nose.    . Loratadine (CLARITIN) 10 MG CAPS Take 10 mg by mouth as needed.    . Multiple Vitamins-Minerals (CENTRUM SILVER ADULT 50+ PO) Take by mouth.    . non-metallic deodorant Jethro Poling) MISC Apply 1 application topically daily as needed. Apply after rad txs daily    . tamoxifen (NOLVADEX) 20 MG tablet TAKE 1 TABLET(20 MG) BY MOUTH DAILY 90 tablet 3   No current facility-administered medications for this visit.     OBJECTIVE: Middle-aged white woman In no acute distress  Vitals:   03/01/17 1308  BP: (!) 164/95  Pulse: (!) 104  Resp: 18  Temp: (!) 97.4 F (36.3 C)  SpO2: 99%     Body mass index is 29.16 kg/m.    ECOG FS: 0   Sclerae unicteric, pupils round and equal Oropharynx  clear and moist No cervical or supraclavicular adenopathy Lungs no rales or rhonchi Heart regular rate and rhythm Abd soft, nontender, positive bowel sounds MSK no focal spinal tenderness, no upper extremity lymphedema Neuro: nonfocal, well oriented, appropriate affect Breasts: Right breast is benign. The left breast is undergone lumpectomy followed by radiation with no evidence of local recurrence. There is moderate distortion of the breast contour both axillae are benign  LAB RESULTS:  CMP     Component Value Date/Time   NA 143 03/02/2016 1142   K 4.1 03/02/2016 1142   CL 107 12/19/2012 0824   CO2 26 03/02/2016 1142   GLUCOSE 88 03/02/2016 1142   GLUCOSE 78 12/19/2012 0824   BUN 15.7 03/02/2016 1142   CREATININE 0.8 03/02/2016 1142   CALCIUM 8.9 03/02/2016 1142   PROT 6.6 03/02/2016 1142   ALBUMIN 3.6 03/02/2016 1142   AST 23 03/02/2016 1142   ALT  27 03/02/2016 1142   ALKPHOS 54 03/02/2016 1142   BILITOT <0.30 03/02/2016 1142    I No results found for: SPEP  Lab Results  Component Value Date   WBC 6.8 03/01/2017   NEUTROABS 4.6 03/01/2017   HGB 13.8 03/01/2017   HCT 41.3 03/01/2017   MCV 88.8 03/01/2017   PLT 180 03/01/2017      Chemistry      Component Value Date/Time   NA 143 03/02/2016 1142   K 4.1 03/02/2016 1142   CL 107 12/19/2012 0824   CO2 26 03/02/2016 1142   BUN 15.7 03/02/2016 1142   CREATININE 0.8 03/02/2016 1142      Component Value Date/Time   CALCIUM 8.9 03/02/2016 1142   ALKPHOS 54 03/02/2016 1142   AST 23 03/02/2016 1142   ALT 27 03/02/2016 1142   BILITOT <0.30 03/02/2016 1142       No results found for: LABCA2  No components found for: LABCA125  No results for input(s): INR in the last 168 hours.  Urinalysis No results found for: COLORURINE  STUDIES: Bilateral diagnostic mammography with tomography at the [facility] 12/14/2016 found the breast density to be category B. There was no evidence of malignancy.  Most recent  bone density 12/02/2014 was normal with a T score of -0.4  ASSESSMENT: 61 y.o. [place], [place] woman status post left breast biopsy 12/12/2012 for a clinical T1c N0, stage IA invasive ductal carcinoma, grade 2, estrogen and progesterone receptor positive, with no HER-2 amplification, and an MIB-1 of approximately 10%.  (1) left lumpectomy and sentinel lymph node sampling 12/31/2012 showed a pT1c pN0(i+), stage IIA invasive ductal carcinoma, grade 2, with repeat HER-2 again negative  (2) Oncotype recurrence score of 13 predicts a 10 year risk of distant recurrence of 8% if the patient's only systemic treatment is tamoxifen for 5 years  (3) adjuvant radiation completed 04/04/2013  (4) started tamoxifen November 2014  PLAN: Evianna is now a little over 4 years out from definitive surgery for her breast cancer with no evidence of disease recurrence. This is very favorable.  She is tolerating tamoxifen well. The plan will be to continue that to a total of 5 years, which will take [place] through October of next year.  Accordingly when she sees me in August 2019 she will be ready to "graduate" from follow-up here  She knows to call for any problems that may develop before the next visit.  Chauncey Cruel, MD

## 2017-04-10 ENCOUNTER — Other Ambulatory Visit: Payer: Self-pay | Admitting: Internal Medicine

## 2017-04-10 DIAGNOSIS — R011 Cardiac murmur, unspecified: Secondary | ICD-10-CM

## 2017-04-14 ENCOUNTER — Other Ambulatory Visit: Payer: Self-pay

## 2017-04-14 ENCOUNTER — Ambulatory Visit (HOSPITAL_COMMUNITY): Payer: Managed Care, Other (non HMO) | Attending: Internal Medicine

## 2017-04-14 DIAGNOSIS — R011 Cardiac murmur, unspecified: Secondary | ICD-10-CM | POA: Diagnosis not present

## 2017-04-14 DIAGNOSIS — E785 Hyperlipidemia, unspecified: Secondary | ICD-10-CM | POA: Diagnosis not present

## 2017-04-14 DIAGNOSIS — I071 Rheumatic tricuspid insufficiency: Secondary | ICD-10-CM | POA: Insufficient documentation

## 2018-01-09 ENCOUNTER — Other Ambulatory Visit: Payer: Self-pay | Admitting: Adult Health

## 2018-01-09 ENCOUNTER — Telehealth: Payer: Self-pay

## 2018-01-09 DIAGNOSIS — Z853 Personal history of malignant neoplasm of breast: Secondary | ICD-10-CM

## 2018-01-09 NOTE — Telephone Encounter (Signed)
Faxed signed order to [facility].

## 2018-01-10 ENCOUNTER — Other Ambulatory Visit: Payer: Self-pay | Admitting: Internal Medicine

## 2018-01-10 DIAGNOSIS — Z853 Personal history of malignant neoplasm of breast: Secondary | ICD-10-CM

## 2018-02-01 ENCOUNTER — Ambulatory Visit
Admission: RE | Admit: 2018-02-01 | Discharge: 2018-02-01 | Disposition: A | Discharge status: Home / Self Care | Payer: Managed Care, Other (non HMO) | Source: Ambulatory Visit | Attending: Internal Medicine | Admitting: Internal Medicine

## 2018-02-01 DIAGNOSIS — Z853 Personal history of malignant neoplasm of breast: Secondary | ICD-10-CM

## 2018-02-20 ENCOUNTER — Other Ambulatory Visit: Payer: Self-pay | Admitting: *Deleted

## 2018-02-20 DIAGNOSIS — Z170 Estrogen receptor positive status [ER+]: Secondary | ICD-10-CM

## 2018-02-20 DIAGNOSIS — C50412 Malignant neoplasm of upper-outer quadrant of left female breast: Secondary | ICD-10-CM

## 2018-02-20 NOTE — Progress Notes (Signed)
ID: [name] OB: 07/08/1955  MR#: [id number]  NID#:[id number]  PCP: Lavone Orn, MD GYN:  Vania Rea SU: Alphonsa Overall OTHER MD: Thea Silversmith, Vernice Jefferson, Asencion Partridge Dohmeier  CHIEF COMPLAINT: Estrogen receptor positive breast cancer  CURRENT THERAPY: taxomifen 105m PO daily  BREAST CANCER HISTORY: From the earlier summary note:  [name] had routine mammography/tomography 11/28/2012 suggesting and abnormality in the left breast. Diagnostic left mammography and ultrasonography at the breast Center 12/07/2012 confirmed a density in the upper outer quadrant of the left breast, which was not palpable by exam. Ultrasound showed an irregular hypoechoic lesion in the left breast measuring 1.5 cm. The left axilla was sonographically benign.  Biopsy of the left breast mass in question 12/12/2012 showed an invasive ductal carcinoma, grade 2, estrogen and progesterone receptor positive, HER-2 not amplified, with an MIB-1 of approximately 10%. Breast MRI 12/18/2012 measured the left breast mass at 2.0 cm. There were no other masses of concern, and no suspicious internal mammary or axillary lymph nodes identified.  The patient's subsequent history is as detailed below.  INTERVAL HISTORY: [name] today for follow-up and treatment of her estrogen receptor positive breast cancer. She continues on tamoxifen, with good tolerance. She has occasional hot flashes that aren't intense. She denies issues with increased vaginal discharge.    Since her last visit, she underwent diagnostic bilateral [facility] with CAD [facility] density category B. There was no evidence of malignancy.    REVIEW OF SYSTEMS: [name] that she is doing well. For exercise, she walks 3 days per week and swims. She denies unusual headaches, visual changes, nausea, vomiting, or dizziness. There has been no unusual cough, phlegm production, or pleurisy. There has been no  change in bowel or bladder habits. She denies unexplained fatigue or unexplained weight loss, bleeding, rash, or fever. A detailed review of systems was otherwise stable.   PAST MEDICAL HISTORY: Past Medical History:  Diagnosis Date  . Breast cancer ([facility] 12/12/12 bx   left breast  . Carotid artery stenosis    hx-dr dohmier monitors  . Personal history of radiation therapy 2014  . PONV (postoperative nausea and vomiting)   . Radiation 02/18/13-04/04/13   left breast  . Seasonal allergies   . Wears glasses   Bilateral carotid artery stenosis, status post dilatation  PAST SURGICAL HISTORY: Past Surgical History:  Procedure Laterality Date  . BACK SURGERY    . BREAST LUMPECTOMY Left 12/31/2012  . BREAST LUMPECTOMY WITH NEEDLE LOCALIZATION AND AXILLARY SENTINEL LYMPH NODE BX Left 12/31/2012   Procedure: LEFT BREAST LUMPECTOMY WITH NEEDLE LOCALIZATION AND LEFT AXILLARY SENTINEL LYMPH NODE BIOPSY;  Surgeon: DShann Medal MD;  [facility]  Service: General;  Laterality: Left;  . COLONOSCOPY  2007  . NASAL POLYP SURGERY    . WISDOM TOOTH EXTRACTION     removal of a nasal polyp  FAMILY HISTORY Family History  Problem Relation Age of Onset  . Uterine cancer Mother   . Brain cancer Paternal Grandfather   . Breast cancer Cousin 521 . Melanoma Daughter   . Breast cancer Cousin 512  the patient's father died from a stroke at age 62 The patient's mother died from a MRSA infection A09/07/2014 She was diagnosed with cancer of the uterus at age 62 The patient had one grandfather diagnosed with cancer of the brain. The patient has one maternal cousin diagnosed with breast cancer in her 673s The  patient herself has 3 brothers and 3 sisters, none with cancer. The patient's daughter was diagnosed with melanoma in situ at the age of 4. She also has a history of DVT. There is no history of ovarian cancer in the family.  GYNECOLOGIC HISTORY:  Menarche age 2, first live  birth age 59, the patient is [place] P3. Her periods are now very irregular, she just had one 12/16/2012, the one prior to that was 03/18/2012.  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 Her husband [name] "[name]" [place], works for [employer] in [place] practically live in [place]"). Daughter Museum/gallery conservator lives in [place] where she works as an a Corporate investment banker. Son Levora Angel") lives in [place] where he works as an Physiological scientist. [place]") is in college and lives with the patient in the summer. There are no grandchildren. The patient attends a local [employer].    ADVANCED DIRECTIVES: Not in place   HEALTH MAINTENANCE: Social History   Tobacco Use  . Smoking status: Never Smoker  . Smokeless tobacco: Never Used  Substance Use Topics  . Alcohol use: Yes    Comment: rare  . Drug use: No     Colonoscopy: 2007/ [facility]  PAP: June 2015  Bone density: May 2016/ normal  Lipid panel:  No Known Allergies  Current Outpatient Medications  Medication Sig Dispense Refill  . aspirin 81 MG chewable tablet Chew 81 mg by mouth daily.    Marland Kitchen atorvastatin (LIPITOR) 10 MG tablet Take 10 mg by mouth daily.    . Calcium Carbonate Antacid (TUMS PO) Take 1 tablet by mouth daily as needed. Pt chews 1 -2 tablet as needed for heartburn.    . Calcium Carbonate-Vitamin D (CALCIUM + D PO) Take by mouth daily.    . citalopram (CELEXA) 10 MG tablet Take 10 mg by mouth daily.    . Fluticasone Propionate (FLONASE NA) Place into the nose.    . Loratadine (CLARITIN) 10 MG CAPS Take 10 mg by mouth as needed.    . Multiple Vitamins-Minerals (CENTRUM SILVER ADULT 50+ PO) Take by mouth.    . non-metallic deodorant Jethro Poling) MISC Apply 1 application topically daily as needed. Apply after rad txs daily    . tamoxifen (NOLVADEX) 20 MG tablet Take 1 tablet (20 mg total) by mouth daily. 90 tablet 3   No current facility-administered medications for this visit.     OBJECTIVE: Middle-aged  white woman who appears stated age  62:   02/21/18 1144  BP: (!) 143/101  Pulse: 100  Resp: 18  Temp: 98.1 F (36.7 C)  SpO2: 99%     Body mass index is 29.42 kg/m.    ECOG FS: 0 Filed Weights   02/21/18 1144  Weight: 171 lb 6.4 oz (77.7 kg)    Sclerae unicteric, EOMs intact No cervical or supraclavicular adenopathy Lungs no rales or rhonchi Heart regular rate and rhythm Abd soft, nontender, positive bowel sounds MSK no focal spinal tenderness, no upper extremity lymphedema Neuro: nonfocal, well oriented, appropriate affect Breasts: Right breast is unremarkable.  The left breast is status post lumpectomy and radiation.  There is no evidence of local recurrence.  Both axillae are benign.  LAB RESULTS:  CMP     Component Value Date/Time   NA 141 03/01/2017 1251   K 3.7 03/01/2017 1251   CL 107 12/19/2012 0824   CO2 27 03/01/2017 1251   GLUCOSE 123 03/01/2017 1251   GLUCOSE 78  12/19/2012 0824   BUN 15.2 03/01/2017 1251   CREATININE 0.8 03/01/2017 1251   CALCIUM 9.5 03/01/2017 1251   PROT 6.7 03/01/2017 1251   ALBUMIN 3.6 03/01/2017 1251   AST 37 (H) 03/01/2017 1251   ALT 50 03/01/2017 1251   ALKPHOS 58 03/01/2017 1251   BILITOT 0.36 03/01/2017 1251    I No results found for: SPEP  Lab Results  Component Value Date   WBC 5.4 02/21/2018   NEUTROABS 3.2 02/21/2018   HGB 14.2 02/21/2018   HCT 42.9 02/21/2018   MCV 87.5 02/21/2018   PLT 203 02/21/2018      Chemistry      Component Value Date/Time   NA 141 03/01/2017 1251   K 3.7 03/01/2017 1251   CL 107 12/19/2012 0824   CO2 27 03/01/2017 1251   BUN 15.2 03/01/2017 1251   CREATININE 0.8 03/01/2017 1251      Component Value Date/Time   CALCIUM 9.5 03/01/2017 1251   ALKPHOS 58 03/01/2017 1251   AST 37 (H) 03/01/2017 1251   ALT 50 03/01/2017 1251   BILITOT 0.36 03/01/2017 1251       No results found for: LABCA2  No components found for: LABCA125  No results for input(s): INR in the last  168 hours.  Urinalysis No results found for: COLORURINE  STUDIES: Since her last visit, she underwent diagnostic bilateral mammography with CAD and tomography on 02/01/2018 at [facility] showing: breast density category B. There was no evidence of malignancy.    ASSESSMENT: 62 y.o. [place], [place] woman status post left breast biopsy 12/12/2012 for a clinical T1c N0, stage IA invasive ductal carcinoma, grade 2, estrogen and progesterone receptor positive, with no HER-2 amplification, and an MIB-1 of approximately 10%.  (1) left lumpectomy and sentinel lymph node sampling 12/31/2012 showed a pT1c pN0(i+), stage IIA invasive ductal carcinoma, grade 2, with repeat HER-2 again negative  (2) Oncotype recurrence score of 13 predicts a 10 year risk of distant recurrence of 8% if the patient's only systemic treatment is tamoxifen for 5 years  (3) adjuvant radiation completed 04/04/2013  (4) started tamoxifen November 2014, completing 5 years September 2019  PLAN: Avni is now a little over 5 years out from definitive surgery for her breast cancer with no evidence of disease recurrence.  This is very favorable.  She has tolerated tamoxifen well.  By taking it for 5 years she has cut in half her risk of this breast cancer recurring and also of developing another breast cancer in the future.  At this point I feel comfortable releasing her to her primary care physician.  All she will need in terms of breast cancer follow-up is yearly mammography and a yearly physician breast exam  I will be glad to see her again at any point in the future but as of now are making no further routine appointments for her here.  Gustava Berland, Virgie Dad, MD  02/21/18 12:12 PM Medical Oncology and Hematology [facility] [address] [place], [place] [address] Tel. [phone]    Fax. [phone]  Alice Rieger, am acting as scribe for Chauncey Cruel MD.  I, Lurline Del MD, have  reviewed the above documentation for accuracy and completeness, and I agree with the above.

## 2018-02-21 ENCOUNTER — Inpatient Hospital Stay: Payer: Managed Care, Other (non HMO)

## 2018-02-21 ENCOUNTER — Inpatient Hospital Stay: Payer: Managed Care, Other (non HMO) | Attending: Oncology | Admitting: Oncology

## 2018-02-21 VITALS — BP 143/101 | HR 100 | Temp 98.1°F | Resp 18 | Ht 64.0 in | Wt 171.4 lb

## 2018-02-21 DIAGNOSIS — C50412 Malignant neoplasm of upper-outer quadrant of left female breast: Secondary | ICD-10-CM

## 2018-02-21 DIAGNOSIS — Z923 Personal history of irradiation: Secondary | ICD-10-CM | POA: Diagnosis not present

## 2018-02-21 DIAGNOSIS — Z170 Estrogen receptor positive status [ER+]: Secondary | ICD-10-CM

## 2018-02-21 DIAGNOSIS — Z853 Personal history of malignant neoplasm of breast: Secondary | ICD-10-CM | POA: Diagnosis present

## 2018-02-21 DIAGNOSIS — I2510 Atherosclerotic heart disease of native coronary artery without angina pectoris: Secondary | ICD-10-CM | POA: Diagnosis not present

## 2018-02-21 LAB — CBC WITH DIFFERENTIAL (CANCER CENTER ONLY)
BASOS ABS: 0.1 10*3/uL (ref 0.0–0.1)
Basophils Relative: 2 %
Eosinophils Absolute: 0 10*3/uL (ref 0.0–0.5)
Eosinophils Relative: 0 %
HEMATOCRIT: 42.9 % (ref 34.8–46.6)
Hemoglobin: 14.2 g/dL (ref 11.6–15.9)
LYMPHS PCT: 30 %
Lymphs Abs: 1.6 10*3/uL (ref 0.9–3.3)
MCH: 28.9 pg (ref 25.1–34.0)
MCHC: 33 g/dL (ref 31.5–36.0)
MCV: 87.5 fL (ref 79.5–101.0)
Monocytes Absolute: 0.5 10*3/uL (ref 0.1–0.9)
Monocytes Relative: 9 %
NEUTROS ABS: 3.2 10*3/uL (ref 1.5–6.5)
Neutrophils Relative %: 59 %
PLATELETS: 203 10*3/uL (ref 145–400)
RBC: 4.91 MIL/uL (ref 3.70–5.45)
RDW: 13 % (ref 11.2–14.5)
WBC: 5.4 10*3/uL (ref 3.9–10.3)

## 2018-02-21 LAB — CMP (CANCER CENTER ONLY)
ALT: 22 U/L (ref 0–44)
ANION GAP: 9 (ref 5–15)
AST: 22 U/L (ref 15–41)
Albumin: 4.1 g/dL (ref 3.5–5.0)
Alkaline Phosphatase: 72 U/L (ref 38–126)
BILIRUBIN TOTAL: 0.5 mg/dL (ref 0.3–1.2)
BUN: 17 mg/dL (ref 8–23)
CO2: 26 mmol/L (ref 22–32)
Calcium: 9.5 mg/dL (ref 8.9–10.3)
Chloride: 106 mmol/L (ref 98–111)
Creatinine: 0.83 mg/dL (ref 0.44–1.00)
Glucose, Bld: 100 mg/dL — ABNORMAL HIGH (ref 70–99)
POTASSIUM: 4.1 mmol/L (ref 3.5–5.1)
Sodium: 141 mmol/L (ref 135–145)
TOTAL PROTEIN: 7.2 g/dL (ref 6.5–8.1)

## 2018-02-22 ENCOUNTER — Telehealth: Payer: Self-pay | Admitting: Oncology

## 2018-02-22 NOTE — Telephone Encounter (Signed)
Per 8/21 no los

## 2019-01-28 ENCOUNTER — Other Ambulatory Visit: Payer: Self-pay | Admitting: Internal Medicine

## 2019-01-28 DIAGNOSIS — Z1231 Encounter for screening mammogram for malignant neoplasm of breast: Secondary | ICD-10-CM

## 2019-03-18 ENCOUNTER — Ambulatory Visit
Admission: RE | Admit: 2019-03-18 | Discharge: 2019-03-18 | Disposition: A | Discharge status: Home / Self Care | Payer: 59 | Source: Ambulatory Visit | Attending: Internal Medicine | Admitting: Internal Medicine

## 2019-03-18 ENCOUNTER — Other Ambulatory Visit: Payer: Self-pay

## 2019-03-18 DIAGNOSIS — Z1231 Encounter for screening mammogram for malignant neoplasm of breast: Secondary | ICD-10-CM

## 2019-07-08 DIAGNOSIS — L608 Other nail disorders: Secondary | ICD-10-CM | POA: Insufficient documentation

## 2019-07-12 ENCOUNTER — Other Ambulatory Visit: Payer: Self-pay | Admitting: Orthopedic Surgery

## 2019-07-12 DIAGNOSIS — M79644 Pain in right finger(s): Secondary | ICD-10-CM

## 2019-07-18 ENCOUNTER — Other Ambulatory Visit: Payer: 59

## 2019-07-19 ENCOUNTER — Other Ambulatory Visit: Payer: Self-pay | Admitting: Orthopedic Surgery

## 2019-07-19 ENCOUNTER — Other Ambulatory Visit: Payer: Self-pay

## 2019-07-19 ENCOUNTER — Ambulatory Visit
Admission: RE | Admit: 2019-07-19 | Discharge: 2019-07-19 | Disposition: A | Discharge status: Home / Self Care | Payer: Managed Care, Other (non HMO) | Source: Ambulatory Visit | Attending: Orthopedic Surgery | Admitting: Orthopedic Surgery

## 2019-07-19 DIAGNOSIS — M79644 Pain in right finger(s): Secondary | ICD-10-CM

## 2019-07-29 ENCOUNTER — Other Ambulatory Visit: Payer: Self-pay | Admitting: Orthopedic Surgery

## 2019-08-01 ENCOUNTER — Other Ambulatory Visit: Payer: Self-pay

## 2019-08-01 ENCOUNTER — Encounter (HOSPITAL_BASED_OUTPATIENT_CLINIC_OR_DEPARTMENT_OTHER): Payer: Self-pay | Admitting: Orthopedic Surgery

## 2019-08-06 ENCOUNTER — Other Ambulatory Visit (HOSPITAL_COMMUNITY)
Admission: RE | Admit: 2019-08-06 | Discharge: 2019-08-06 | Disposition: A | Discharge status: Home / Self Care | Payer: Managed Care, Other (non HMO) | Source: Ambulatory Visit | Attending: Orthopedic Surgery | Admitting: Orthopedic Surgery

## 2019-08-06 DIAGNOSIS — Z20822 Contact with and (suspected) exposure to covid-19: Secondary | ICD-10-CM | POA: Insufficient documentation

## 2019-08-06 DIAGNOSIS — Z01812 Encounter for preprocedural laboratory examination: Secondary | ICD-10-CM | POA: Diagnosis not present

## 2019-08-06 LAB — SARS CORONAVIRUS 2 (TAT 6-24 HRS): SARS Coronavirus 2: NEGATIVE

## 2019-08-06 NOTE — Progress Notes (Signed)

## 2019-08-09 ENCOUNTER — Ambulatory Visit (HOSPITAL_BASED_OUTPATIENT_CLINIC_OR_DEPARTMENT_OTHER): Payer: Managed Care, Other (non HMO) | Admitting: Anesthesiology

## 2019-08-09 ENCOUNTER — Encounter (HOSPITAL_BASED_OUTPATIENT_CLINIC_OR_DEPARTMENT_OTHER): Payer: Self-pay | Admitting: Orthopedic Surgery

## 2019-08-09 ENCOUNTER — Ambulatory Visit (HOSPITAL_BASED_OUTPATIENT_CLINIC_OR_DEPARTMENT_OTHER)
Admission: RE | Admit: 2019-08-09 | Discharge: 2019-08-09 | Disposition: A | Discharge status: Home / Self Care | Payer: Managed Care, Other (non HMO) | Attending: Orthopedic Surgery | Admitting: Orthopedic Surgery

## 2019-08-09 ENCOUNTER — Encounter (HOSPITAL_BASED_OUTPATIENT_CLINIC_OR_DEPARTMENT_OTHER)
Admission: RE | Disposition: A | Discharge status: Home / Self Care | Payer: Self-pay | Source: Home / Self Care | Attending: Orthopedic Surgery

## 2019-08-09 ENCOUNTER — Other Ambulatory Visit: Payer: Self-pay

## 2019-08-09 DIAGNOSIS — Z853 Personal history of malignant neoplasm of breast: Secondary | ICD-10-CM | POA: Diagnosis not present

## 2019-08-09 DIAGNOSIS — I10 Essential (primary) hypertension: Secondary | ICD-10-CM | POA: Diagnosis not present

## 2019-08-09 DIAGNOSIS — D492 Neoplasm of unspecified behavior of bone, soft tissue, and skin: Secondary | ICD-10-CM | POA: Insufficient documentation

## 2019-08-09 DIAGNOSIS — M19041 Primary osteoarthritis, right hand: Secondary | ICD-10-CM | POA: Diagnosis not present

## 2019-08-09 HISTORY — PX: MASS EXCISION: SHX2000

## 2019-08-09 SURGERY — EXCISION MASS
Anesthesia: Regional | Site: Finger | Laterality: Right

## 2019-08-09 MED ORDER — LACTATED RINGERS IV SOLN: INTRAVENOUS | Status: DC

## 2019-08-09 MED ORDER — MIDAZOLAM HCL 2 MG/2ML IJ SOLN
INTRAMUSCULAR | Status: AC
  Filled 2019-08-09: qty 2

## 2019-08-09 MED ORDER — BUPIVACAINE HCL (PF) 0.25 % IJ SOLN
INTRAMUSCULAR | Status: DC | PRN
  Administered 2019-08-09: 8 mL

## 2019-08-09 MED ORDER — FENTANYL CITRATE (PF) 100 MCG/2ML IJ SOLN: 25.0000 ug | INTRAMUSCULAR | Status: DC | PRN

## 2019-08-09 MED ORDER — FENTANYL CITRATE (PF) 100 MCG/2ML IJ SOLN
INTRAMUSCULAR | Status: AC
  Filled 2019-08-09: qty 2

## 2019-08-09 MED ORDER — PROPOFOL 500 MG/50ML IV EMUL
INTRAVENOUS | Status: DC | PRN
  Administered 2019-08-09: 25 ug/kg/min via INTRAVENOUS

## 2019-08-09 MED ORDER — MIDAZOLAM HCL 2 MG/2ML IJ SOLN
1.0000 mg | INTRAMUSCULAR | Status: DC | PRN
  Administered 2019-08-09: 2 mg via INTRAVENOUS

## 2019-08-09 MED ORDER — CEFAZOLIN SODIUM-DEXTROSE 2-4 GM/100ML-% IV SOLN
2.0000 g | INTRAVENOUS | Status: AC
  Administered 2019-08-09: 14:00:00 2 g via INTRAVENOUS

## 2019-08-09 MED ORDER — CEFAZOLIN SODIUM-DEXTROSE 2-4 GM/100ML-% IV SOLN
INTRAVENOUS | Status: AC
  Filled 2019-08-09: qty 100

## 2019-08-09 MED ORDER — ACETAMINOPHEN 500 MG PO TABS: 1000.0000 mg | ORAL_TABLET | Freq: Once | ORAL | Status: DC

## 2019-08-09 MED ORDER — TRAMADOL HCL 50 MG PO TABS: 50.0000 mg | ORAL_TABLET | Freq: Four times a day (QID) | ORAL | 0 refills | Status: DC | PRN

## 2019-08-09 MED ORDER — CHLORHEXIDINE GLUCONATE 4 % EX LIQD: 60.0000 mL | Freq: Once | CUTANEOUS | Status: DC

## 2019-08-09 MED ORDER — PROMETHAZINE HCL 25 MG/ML IJ SOLN: 6.2500 mg | INTRAMUSCULAR | Status: DC | PRN

## 2019-08-09 MED ORDER — OXYCODONE HCL 5 MG/5ML PO SOLN: 5.0000 mg | Freq: Once | ORAL | Status: DC | PRN

## 2019-08-09 MED ORDER — FENTANYL CITRATE (PF) 100 MCG/2ML IJ SOLN
50.0000 ug | INTRAMUSCULAR | Status: DC | PRN
  Administered 2019-08-09: 14:00:00 50 ug via INTRAVENOUS

## 2019-08-09 MED ORDER — OXYCODONE HCL 5 MG PO TABS: 5.0000 mg | ORAL_TABLET | Freq: Once | ORAL | Status: DC | PRN

## 2019-08-09 SURGICAL SUPPLY — 47 items
BLADE SURG 15 STRL LF DISP TIS (BLADE) ×1 IMPLANT
BLADE SURG 15 STRL SS (BLADE) ×1
BNDG COHESIVE 1X5 TAN STRL LF (GAUZE/BANDAGES/DRESSINGS) ×2 IMPLANT
BNDG COHESIVE 2X5 TAN STRL LF (GAUZE/BANDAGES/DRESSINGS) IMPLANT
BNDG COHESIVE 3X5 TAN STRL LF (GAUZE/BANDAGES/DRESSINGS) IMPLANT
BNDG ESMARK 4X9 LF (GAUZE/BANDAGES/DRESSINGS) IMPLANT
BNDG GAUZE ELAST 4 BULKY (GAUZE/BANDAGES/DRESSINGS) IMPLANT
CHLORAPREP W/TINT 26 (MISCELLANEOUS) ×2 IMPLANT
CORD BIPOLAR FORCEPS 12FT (ELECTRODE) ×2 IMPLANT
COVER BACK TABLE 60X90IN (DRAPES) ×2 IMPLANT
COVER MAYO STAND STRL (DRAPES) ×2 IMPLANT
COVER WAND RF STERILE (DRAPES) IMPLANT
CUFF TOURN SGL QUICK 18X4 (TOURNIQUET CUFF) ×2 IMPLANT
DECANTER SPIKE VIAL GLASS SM (MISCELLANEOUS) IMPLANT
DRAIN PENROSE 1/2X12 LTX STRL (WOUND CARE) IMPLANT
DRAPE EXTREMITY T 121X128X90 (DISPOSABLE) ×2 IMPLANT
DRAPE SURG 17X23 STRL (DRAPES) ×2 IMPLANT
GAUZE SPONGE 4X4 12PLY STRL (GAUZE/BANDAGES/DRESSINGS) IMPLANT
GAUZE SPONGE 4X4 12PLY STRL LF (GAUZE/BANDAGES/DRESSINGS) ×4 IMPLANT
GAUZE XEROFORM 1X8 LF (GAUZE/BANDAGES/DRESSINGS) ×2 IMPLANT
GLOVE BIOGEL PI IND STRL 7.0 (GLOVE) ×1 IMPLANT
GLOVE BIOGEL PI IND STRL 8.5 (GLOVE) ×1 IMPLANT
GLOVE BIOGEL PI INDICATOR 7.0 (GLOVE) ×1
GLOVE BIOGEL PI INDICATOR 8.5 (GLOVE) ×1
GLOVE ECLIPSE 6.5 STRL STRAW (GLOVE) ×2 IMPLANT
GLOVE EXAM NITRILE MD LF STRL (GLOVE) ×2 IMPLANT
GLOVE SURG ORTHO 8.0 STRL STRW (GLOVE) ×2 IMPLANT
GOWN STRL REUS W/ TWL LRG LVL3 (GOWN DISPOSABLE) ×1 IMPLANT
GOWN STRL REUS W/TWL LRG LVL3 (GOWN DISPOSABLE) ×1
GOWN STRL REUS W/TWL XL LVL3 (GOWN DISPOSABLE) ×2 IMPLANT
NDL SAFETY ECLIPSE 18X1.5 (NEEDLE) IMPLANT
NEEDLE HYPO 18GX1.5 SHARP (NEEDLE)
NEEDLE PRECISIONGLIDE 27X1.5 (NEEDLE) ×2 IMPLANT
NS IRRIG 1000ML POUR BTL (IV SOLUTION) ×2 IMPLANT
PACK BASIN DAY SURGERY FS (CUSTOM PROCEDURE TRAY) ×2 IMPLANT
PAD CAST 3X4 CTTN HI CHSV (CAST SUPPLIES) IMPLANT
PADDING CAST COTTON 3X4 STRL (CAST SUPPLIES)
SPLINT FINGER 3.25 BULB 911905 (SOFTGOODS) ×2 IMPLANT
SPLINT PLASTER CAST XFAST 3X15 (CAST SUPPLIES) IMPLANT
SPLINT PLASTER XTRA FASTSET 3X (CAST SUPPLIES)
STOCKINETTE 4X48 STRL (DRAPES) ×2 IMPLANT
SUT ETHILON 4 0 PS 2 18 (SUTURE) ×2 IMPLANT
SUT VIC AB 4-0 P2 18 (SUTURE) IMPLANT
SYR BULB 3OZ (MISCELLANEOUS) ×2 IMPLANT
SYR CONTROL 10ML LL (SYRINGE) ×2 IMPLANT
TOWEL GREEN STERILE FF (TOWEL DISPOSABLE) ×2 IMPLANT
UNDERPAD 30X36 HEAVY ABSORB (UNDERPADS AND DIAPERS) ×2 IMPLANT

## 2019-08-09 NOTE — Anesthesia Procedure Notes (Signed)
Anesthesia Regional Block: Bier block (IV Regional)   Pre-Anesthetic Checklist: ,, timeout performed, Correct Patient, Correct Site, Correct Laterality, Correct Procedure,, site marked, surgical consent,, at surgeon's request  Laterality: Right  Prep: chloraprep       Needles:  Injection technique: Single-shot  Needle Type: Other      Needle Gauge: 22     Additional Needles:   Procedures:,,,,, intact distal pulses, Esmarch exsanguination, single tourniquet utilized,  Narrative:  Start time: 08/09/2019 2:15 PM End time: 08/09/2019 2:16 PM Injection made incrementally with aspirations every 35 mL.  Performed by: Personally   Additional Notes: Esmark wrap, torq inflated, neg pulse, slowy injected 0.5% pres free lido, pt tol well

## 2019-08-09 NOTE — Brief Op Note (Signed)
08/09/2019  2:43 PM  PATIENT:  [name]  64 y.o. female  PRE-OPERATIVE DIAGNOSIS:  mucoid tumor right index finger, degenerative joint disease, distal interphalangeal joint  POST-OPERATIVE DIAGNOSIS:  mucoid tumor right index finger, degenerative joint disease, distal interphalangeal joint  PROCEDURE:  Procedure(s) with comments: EXCISION MUCOID TUMOR, DEBRIDEMENT DISTAL INTERPHALANGEAL RIGHT INDEX FINGER (Right) - IV REGIONAL FOREARM BIER BLOCK  SURGEON:  Surgeon(s) and Role:    * Daryll Brod, MD - Primary  PHYSICIAN ASSISTANT:   ASSISTANTS: none   ANESTHESIA:   local, regional and IV sedation  EBL:  2 mL   BLOOD ADMINISTERED:none  DRAINS: none   LOCAL MEDICATIONS USED:  BUPIVICAINE   SPECIMEN:  Excision  DISPOSITION OF SPECIMEN:  PATHOLOGY  COUNTS:  YES  TOURNIQUET:   Total Tourniquet Time Documented: Forearm (Right) - 24 minutes Total: Forearm (Right) - 24 minutes   DICTATION: .Dragon Dictation  PLAN OF CARE: Discharge to home after PACU  PATIENT DISPOSITION:  PACU - hemodynamically stable.

## 2019-08-09 NOTE — Op Note (Signed)
NAME: Damonica Olano [facility] MEDICAL RECORD NO: [id number] DATE OF BIRTH: 06/07/1956 FAC[facility]: [facility] LOCATION: [facility] PHYSICIAN: Wynonia Sours, MD   OPERATIVE REPORT   DATE OF PROCEDURE: 08/09/19    PREOPERATIVE DIAGNOSIS:  Mucoid tumor right index finger   POSTOPERATIVE DIAGNOSIS:   Same   PROCEDURE:   Excision mucoid tumor debridement distal phalangeal joint right index finger   SURGEON: Daryll Brod, M.D.   ASSISTANT: none   ANESTHESIA:  Bier block with sedation and Local   INTRAVENOUS FLUIDS:  Per anesthesia flow sheet.   ESTIMATED BLOOD LOSS:  Minimal.   COMPLICATIONS:  None.   SPECIMENS:  cyst synovial tissue exostosis   TOURNIQUET TIME:    Total Tourniquet Time Documented: Forearm (Right) - 24 minutes Total: Forearm (Right) - 24 minutes    DISPOSITION:  Stable to PACU.   INDICATIONS: Patient 64 year old female with a history of a mass in the distal phalanx of her right index finger with grooving of the nail plate distally.  Been going on for months.  The mass did not transilluminate and ultrasound was done revealing a cystic lesion.  He is admitted now for excision of the mass with debridement of the distal phalangeal joint.  Preperi-and postoperative course been discussed along with risks and complications.  She is aware that there is no guarantee to the surgery the possibility of infection recurrence injury to arteries nerves tendons incomplete relief of symptoms and dystrophy.  In the preoperative area the patient is seen the extremity marked by both patient and surgeon antibiotic given  OPERATIVE COURSE: Patient is brought to the operating room where form based IV regional anesthetic was carried out without difficulty.  She was prepped using ChloraPrep in the supine position with the right arm free.  A 3-minute dry time was allowed and timeout taken to confirm patient procedure.  A metacarpal block was given quarter percent bupivacaine without  epinephrine 9 cc was used.  A curvilinear incision was made over the distal phalangeal joint onto the radial side of the index finger right hand.  This carried down through subcutaneous tissue.  Bleeders were electrocauterized as necessary with bipolar.  The cyst was approached by tunneling under the skin distally.  This was then surrounded with house curettes and a hemostatic rondure to remove the cystic lesion.  This was sent to pathology the.  The joint was then opened on the radial aspect just volar to the extensor tendon.  Osteophytes on the middle phalanx were identified and removed with the house curette and the hemostatic rondure.  This was also sent to pathology.  The large excrescent of skin at the area of the nail fold distally in the area of the pressure on the nail plate was then removed with blunt sharp dissection.  The wound was copiously irrigated with saline.  The skin was closed interrupted 4-0 nylon sutures.  A sterile dressing and splint was applied to the finger.  Inflation of the tourniquet remaining fingers pink.  She was taken to the recovery room for observation in satisfactory condition.  The patient will be discharged home to return to [place] in 1 week on Tylenol for pain with Ultram for breakthrough.   Daryll Brod, MD Electronically signed, 08/09/19

## 2019-08-09 NOTE — Discharge Instructions (Addendum)

## 2019-08-09 NOTE — Anesthesia Preprocedure Evaluation (Addendum)
Anesthesia Evaluation  Patient identified by MRN, date of birth, ID band Patient awake    Reviewed: Allergy & Precautions, NPO status , Patient's Chart, lab work & pertinent test results  History of Anesthesia Complications (+) PONV and history of anesthetic complications  Airway Mallampati: II  TM Distance: >3 FB Neck ROM: Full    Dental no notable dental hx.    Pulmonary neg pulmonary ROS,    Pulmonary exam normal        Cardiovascular hypertension, Pt. on medications Normal cardiovascular exam     Neuro/Psych negative neurological ROS  negative psych ROS   GI/Hepatic negative GI ROS, Neg liver ROS,   Endo/Other  negative endocrine ROS  Renal/GU negative Renal ROS  negative genitourinary   Musculoskeletal negative musculoskeletal ROS (+)   Abdominal   Peds  Hematology negative hematology ROS (+)   Anesthesia Other Findings mucoid tumor right index finger  Reproductive/Obstetrics negative OB ROS                            Anesthesia Physical Anesthesia Plan  ASA: II  Anesthesia Plan: Bier Block and Bier Block-LIDOCAINE ONLY   Post-op Pain Management:    Induction:   PONV Risk Score and Plan: 3 and Treatment may vary due to age or medical condition, Midazolam, Propofol infusion and Ondansetron  Airway Management Planned: Natural Airway and Simple Face Mask  Additional Equipment: None  Intra-op Plan:   Post-operative Plan:   Informed Consent: I have reviewed the patients History and Physical, chart, labs and discussed the procedure including the risks, benefits and alternatives for the proposed anesthesia with the patient or authorized representative who has indicated his/her understanding and acceptance.       Plan Discussed with: CRNA  Anesthesia Plan Comments:        Anesthesia Quick Evaluation

## 2019-08-09 NOTE — H&P (Signed)
[name] is an 64 y.o. female.   Chief Complaint: mucoid tumor right index finger HPI: [name] is a 64 year old right-hand-dominant female referred by Dr. Renda Rolls for consultation regarding a deformity of her right index finger nail. This has been present since June. She recalls no history of discrete injury although she states that she did hit it with opening a plastic jar. She has not complained of any pain. She has no history of prior problems. He is complaining of a groove in the nail plate along the entire distal portion of the nail itself. Nothing seems to have made it better or worse she has no history of diabetes thyroid problems or gout. She does have a history of arthritis. Family history is positive diabetes arthritis negative for thyroid problems and gout.   Past Medical History:  Diagnosis Date  . Breast cancer ([place]) 12/12/12 bx   left breast  . Carotid artery stenosis    hx-dr dohmier monitors  . Personal history of radiation therapy 2014  . PONV (postoperative nausea and vomiting)   . Radiation 02/18/13-04/04/13   left breast  . Seasonal allergies   . Wears glasses     Past Surgical History:  Procedure Laterality Date  . BACK SURGERY    . BREAST LUMPECTOMY Left 12/31/2012  . BREAST LUMPECTOMY WITH NEEDLE LOCALIZATION AND AXILLARY SENTINEL LYMPH NODE BX Left 12/31/2012   Procedure: LEFT BREAST LUMPECTOMY WITH NEEDLE LOCALIZATION AND LEFT AXILLARY SENTINEL LYMPH NODE BIOPSY;  Surgeon: Shann Medal, MD;  Location: [place];  Service: General;  Laterality: Left;  . COLONOSCOPY  2007  . NASAL POLYP SURGERY    . WISDOM TOOTH EXTRACTION      Family History  Problem Relation Age of Onset  . Uterine cancer Mother   . Brain cancer Paternal Grandfather   . Breast cancer Cousin 69  . Melanoma Daughter   . Breast cancer Cousin 93   Social History:  reports that she has never smoked. She has never used smokeless tobacco. She reports current alcohol  use. She reports that she does not use drugs.  Allergies: No Known Allergies  No medications prior to admission.    No results found for this or any previous visit (from the past 48 hour(s)).  No results found.   Pertinent items are noted in HPI.  Height 5\' 4"  (1.626 m), weight 77.1 kg.  General appearance: alert, cooperative and appears stated age Head: Normocephalic, without obvious abnormality Neck: no JVD Resp: clear to auscultation bilaterally Cardio: regular rate and rhythm, S1, S2 normal, no murmur, click, rub or gallop GI: soft, non-tender; bowel sounds normal; no masses,  no organomegaly Extremities: mucoid tumor right index finger DJD DIP Pulses: 2+ and symmetric Skin: Skin color, texture, turgor normal. No rashes or lesions Neurologic: Grossly normal Incision/Wound: na  Assessment/Plan Her ultrasound from Dr. Zigmund Daniel is reviewed with her including images revealing a cyst on the dorsal aspect radial side index finger right hand. 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 Preperi-and postoperative course are discussed along with risk complications. She is aware that there is no guarantee to the surgery the possibility of infection recurrence injury to arteries nerves tendons complete relief symptoms just possibility of recurrence. Possibility of incomplete return of normalcy to the nail plate. Would like to proceed and  is scheduled for excision mucoid cyst debridement distal phalangeal joint right index finger as an outpatient under regional anesthesia.    Daryll Brod 08/09/2019, 5:54 AM

## 2019-08-09 NOTE — Anesthesia Postprocedure Evaluation (Signed)
Anesthesia Post Note  Patient: [name]  Procedure(s) Performed: EXCISION MUCOID TUMOR, DEBRIDEMENT DISTAL INTERPHALANGEAL RIGHT INDEX FINGER (Right Finger)     Patient location during evaluation: PACU Anesthesia Type: Bier Block Level of consciousness: awake and alert and oriented Pain management: pain level controlled Vital Signs Assessment: post-procedure vital signs reviewed and stable Respiratory status: spontaneous breathing, nonlabored ventilation and respiratory function stable Cardiovascular status: blood pressure returned to baseline Postop Assessment: no apparent nausea or vomiting Anesthetic complications: no    Last Vitals:  Vitals:   08/09/19 1500 08/09/19 1512  BP: 127/84 140/76  Pulse: 63   Resp: 15   Temp:    SpO2: 96% 100%    Last Pain:  Vitals:   08/09/19 1512  TempSrc:   PainSc: 0-No pain                 Brennan Bailey

## 2019-08-09 NOTE — Transfer of Care (Signed)
Immediate Anesthesia Transfer of Care Note  Patient: [name]  Procedure(s) Performed: EXCISION MUCOID TUMOR, DEBRIDEMENT DISTAL INTERPHALANGEAL RIGHT INDEX FINGER (Right Finger)  Patient Location: PACU  Anesthesia Type:MAC  Level of Consciousness: awake and alert   Airway & Oxygen Therapy: Patient Spontanous Breathing and Patient connected to face mask oxygen  Post-op Assessment: Report given to RN and Post -op Vital signs reviewed and stable  Post vital signs: Reviewed and stable  Last Vitals:  Vitals Value Taken Time  BP 108/66 08/09/19 1443  Temp    Pulse 78 08/09/19 1445  Resp 16 08/09/19 1445  SpO2 100 % 08/09/19 1445  Vitals shown include unvalidated device data.  Last Pain:  Vitals:   08/09/19 1318  TempSrc: Tympanic  PainSc: 0-No pain      Patients Stated Pain Goal: 7 (19/16/60 6004)  Complications: No apparent anesthesia complications

## 2019-08-12 LAB — SURGICAL PATHOLOGY

## 2019-08-13 ENCOUNTER — Encounter: Payer: Self-pay | Admitting: *Deleted

## 2019-08-16 DIAGNOSIS — M6740 Ganglion, unspecified site: Secondary | ICD-10-CM | POA: Insufficient documentation

## 2019-08-16 DIAGNOSIS — M79644 Pain in right finger(s): Secondary | ICD-10-CM | POA: Insufficient documentation

## 2019-09-20 DIAGNOSIS — M19041 Primary osteoarthritis, right hand: Secondary | ICD-10-CM | POA: Insufficient documentation

## 2020-01-20 ENCOUNTER — Other Ambulatory Visit: Payer: Self-pay | Admitting: Internal Medicine

## 2020-01-20 DIAGNOSIS — Z1231 Encounter for screening mammogram for malignant neoplasm of breast: Secondary | ICD-10-CM

## 2020-03-30 ENCOUNTER — Ambulatory Visit
Admission: RE | Admit: 2020-03-30 | Discharge: 2020-03-30 | Disposition: A | Discharge status: Home / Self Care | Payer: Managed Care, Other (non HMO) | Source: Ambulatory Visit | Attending: Internal Medicine | Admitting: Internal Medicine

## 2020-03-30 ENCOUNTER — Other Ambulatory Visit: Payer: Self-pay

## 2020-03-30 DIAGNOSIS — Z1231 Encounter for screening mammogram for malignant neoplasm of breast: Secondary | ICD-10-CM

## 2020-05-18 ENCOUNTER — Other Ambulatory Visit: Payer: Self-pay

## 2020-05-18 ENCOUNTER — Ambulatory Visit: Payer: 59 | Attending: Internal Medicine | Admitting: Physical Therapy

## 2020-05-18 DIAGNOSIS — H8111 Benign paroxysmal vertigo, right ear: Secondary | ICD-10-CM

## 2020-05-18 NOTE — Patient Instructions (Signed)
How to Perform the Epley Maneuver The Epley maneuver is an exercise that relieves symptoms of vertigo. Vertigo is the feeling that you or your surroundings are moving when they are not. When you feel vertigo, you may feel like the room is spinning and have trouble walking. Dizziness is a little different than vertigo. When you are dizzy, you may feel unsteady or light-headed. You can do this maneuver at home whenever you have symptoms of vertigo. You can do it up to 3 times a day until your symptoms go away. Even though the Epley maneuver may relieve your vertigo for a few weeks, it is possible that your symptoms will return. This maneuver relieves vertigo, but it does not relieve dizziness. What are the risks? If it is done correctly, the Epley maneuver is considered safe. Sometimes it can lead to dizziness or nausea that goes away after a short time. If you develop other symptoms, such as changes in vision, weakness, or numbness, stop doing the maneuver and call your health care provider. How to perform the Epley maneuver 1. Sit on the edge of a bed or table with your back straight and your legs extended or hanging over the edge of the bed or table. 2. Turn your head halfway toward the affected ear or side. 3. Lie backward quickly with your head turned until you are lying flat on your back. You may want to position a pillow under your shoulders. 4. Hold this position for 30 seconds. You may experience an attack of vertigo. This is normal. 5. Turn your head to the opposite direction until your unaffected ear is facing the floor. 6. Hold this position for 30 seconds. You may experience an attack of vertigo. This is normal. Hold this position until the vertigo stops. 7. Turn your whole body to the same side as your head. Hold for another 30 seconds. 8. Sit back up. You can repeat this exercise up to 3 times a day. Follow these instructions at home:  After doing the Epley maneuver, you can return  to your normal activities.  Ask your health care provider if there is anything you should do at home to prevent vertigo. He or she may recommend that you: ? Keep your head raised (elevated) with two or more pillows while you sleep. ? Do not sleep on the side of your affected ear. ? Get up slowly from bed. ? Avoid sudden movements during the day. ? Avoid extreme head movement, like looking up or bending over. Contact a health care provider if:  Your vertigo gets worse.  You have other symptoms, including: ? Nausea. ? Vomiting. ? Headache. Get help right away if:  You have vision changes.  You have a severe or worsening headache or neck pain.  You cannot stop vomiting.  You have new numbness or weakness in any part of your body. Summary  Vertigo is the feeling that you or your surroundings are moving when they are not.  The Epley maneuver is an exercise that relieves symptoms of vertigo.  If the Epley maneuver is done correctly, it is considered safe. You can do it up to 3 times a day. This information is not intended to replace advice given to you by your health care provider. Make sure you discuss any questions you have with your health care provider. Document Revised: 06/02/2017 Document Reviewed: 05/10/2016 Elsevier Patient Education  [place] for Right Posterior / Anterior Canalithiasis  Sitting on bed: 1. Turn head 45 right. (a) Lie back slowly, shoulders on pillow, head on bed. (b) Hold _20-30___ seconds. 2. Keeping head on bed, turn head 90 left. Hold _20-30___ seconds. 3. Roll to left, head on 45 angle down toward bed. Hold __20-30__ seconds. 4. Sit up on left side of bed. Repeat _3___ times per session. Do _2-3 ___ sessions per day.  Copyright  VHI. All rights reserved.  Benign Positional Vertigo Vertigo is the feeling that you or your surroundings are moving when they are not. Benign positional vertigo is the most common  form of vertigo. This is usually a harmless condition (benign). This condition is positional. This means that symptoms are triggered by certain movements and positions. This condition can be dangerous if it occurs while you are doing something that could cause harm to you or others. This includes activities such as driving or operating machinery. What are the causes? In many cases, the cause of this condition is not known. It may be caused by a disturbance in an area of the inner ear that helps your brain to sense movement and balance. This disturbance can be caused by:  Viral infection (labyrinthitis).  Head injury.  Repetitive motion, such as jumping, dancing, or running. What increases the risk? You are more likely to develop this condition if:  You are a woman.  You are 64 years of age or older. What are the signs or symptoms? Symptoms of this condition usually happen when you move your head or your eyes in different directions. Symptoms may start suddenly, and usually last for less than a minute. They include:  Loss of balance and falling.  Feeling like you are spinning or moving.  Feeling like your surroundings are spinning or moving.  Nausea and vomiting.  Blurred vision.  Dizziness.  Involuntary eye movement (nystagmus). Symptoms can be mild and cause only minor problems, or they can be severe and interfere with daily life. Episodes of benign positional vertigo may return (recur) over time. Symptoms may improve over time. How is this diagnosed? This condition may be diagnosed based on:  Your medical history.  Physical exam of the head, neck, and ears.  Tests, such as: ? MRI. ? CT scan. ? Eye movement tests. Your health care provider may ask you to change positions quickly while he or she watches you for symptoms of benign positional vertigo, such as nystagmus. Eye movement may be tested with a variety of exams that are designed to evaluate or stimulate  vertigo. ? An electroencephalogram (EEG). This records electrical activity in your brain. ? Hearing tests. You may be referred to a health care provider who specializes in ear, nose, and throat (ENT) problems (otolaryngologist) or a provider who specializes in disorders of the nervous system (neurologist). How is this treated?  This condition may be treated in a session in which your health care provider moves your head in specific positions to adjust your inner ear back to normal. Treatment for this condition may take several sessions. Surgery may be needed in severe cases, but this is rare. In some cases, benign positional vertigo may resolve on its own in 2-4 weeks. Follow these instructions at home: Safety  Move slowly. Avoid sudden body or head movements or certain positions, as told by your health care provider.  Avoid driving until your health care provider says it is safe for you to do so.  Avoid operating heavy machinery until your health care provider says it is safe  for you to do so.  Avoid doing any tasks that would be dangerous to you or others if vertigo occurs.  If you have trouble walking or keeping your balance, try using a cane for stability. If you feel dizzy or unstable, sit down right away.  Return to your normal activities as told by your health care provider. Ask your health care provider what activities are safe for you. General instructions  Take over-the-counter and prescription medicines only as told by your health care provider.  Drink enough fluid to keep your urine pale yellow.  Keep all follow-up visits as told by your health care provider. This is important. Contact a health care provider if:  You have a fever.  Your condition gets worse or you develop new symptoms.  Your family or friends notice any behavioral changes.  You have nausea or vomiting that gets worse.  You have numbness or a "pins and needles" sensation. Get help right away if  you:  Have difficulty speaking or moving.  Are always dizzy.  Faint.  Develop severe headaches.  Have weakness in your legs or arms.  Have changes in your hearing or vision.  Develop a stiff neck.  Develop sensitivity to light. Summary  Vertigo is the feeling that you or your surroundings are moving when they are not. Benign positional vertigo is the most common form of vertigo.  The cause of this condition is not known. It may be caused by a disturbance in an area of the inner ear that helps your brain to sense movement and balance.  Symptoms include loss of balance and falling, feeling that you or your surroundings are moving, nausea and vomiting, and blurred vision.  This condition can be diagnosed based on symptoms, physical exam, and other tests, such as MRI, CT scan, eye movement tests, and hearing tests.  Follow safety instructions as told by your health care provider. You will also be told when to contact your health care provider in case of problems. This information is not intended to replace advice given to you by your health care provider. Make sure you discuss any questions you have with your health care provider. Document Revised: 11/29/2017 Document Reviewed: 11/29/2017 Elsevier Patient Education  [place].

## 2020-05-19 ENCOUNTER — Encounter: Payer: Self-pay | Admitting: Physical Therapy

## 2020-05-19 NOTE — Therapy (Signed)
[facility] [address] [place], [place], [address] Phone: [phone]   Fax:  [phone]  Physical Therapy Evaluation  Patient Details  Name: [name] MRN: [id number] Date of Birth: [birth year] Referring Provider (PT): Lavone Orn, MD   Encounter Date: 05/18/2020   PT End of Session - 05/19/20 2042    Visit Number 1    Number of Visits 1    Authorization Type Aet[employer]    PT Start Time 0800    PT Stop Time 0846    PT Time Calculation (min) 46 min    Activity Tolerance Patient tolerated treatment well    Behavior During Therapy [facility] for tasks assessed/performed           Past Medical History:  Diagnosis Date  . Breast cancer ([place]) 12/12/12 bx   left breast  . Carotid artery stenosis    hx-dr dohmier monitors  . Personal history of radiation therapy 2014  . PONV (postoperative nausea and vomiting)   . Radiation 02/18/13-04/04/13   left breast  . Seasonal allergies   . Wears glasses     Past Surgical History:  Procedure Laterality Date  . BACK SURGERY    . BREAST LUMPECTOMY Left 12/31/2012  . BREAST LUMPECTOMY WITH NEEDLE LOCALIZATION AND AXILLARY SENTINEL LYMPH NODE BX Left 12/31/2012   Procedure: LEFT BREAST LUMPECTOMY WITH NEEDLE LOCALIZATION AND LEFT AXILLARY SENTINEL LYMPH NODE BIOPSY;  Surgeon: Shann Medal, MD;  Location: [facility];  Service: General;  Laterality: Left;  . COLONOSCOPY  2007  . MASS EXCISION Right 08/09/2019   Procedure: EXCISION MUCOID TUMOR, DEBRIDEMENT DISTAL INTERPHALANGEAL RIGHT INDEX FINGER;  Surgeon: Daryll Brod, MD;  Location: [facility];  Service: Orthopedics;  Laterality: Right;  IV REGIONAL FOREARM BIER BLOCK  . NASAL POLYP SURGERY    . WISDOM TOOTH EXTRACTION      There were no vitals filed for this visit.        [facility] PT Assessment - 05/19/20 0001      Assessment   Medical Diagnosis Vertigo    Referring Provider (PT) Lavone Orn, MD    Onset Date/Surgical Date 05/09/20    Prior Therapy none      Precautions   Precautions None      Balance Screen   Has the patient fallen in the past 6 months No    Has the patient had a decrease in activity level because of a fear of falling?  No    Is the patient reluctant to leave their home because of a fear of falling?  No      Prior Function   Level of Independence Independent                  Vestibular Assessment - 05/19/20 0001      Symptom Behavior   Subjective history of current problem pt states she had onset of spinning vertigo on Friday, Nov. 6 - improved by Wed. , 05-13-20; states she did the exercise as instructed by Dr. Laurann Montana to treat it     Type of Dizziness  Spinning    Frequency of Dizziness was daily until Wed., 05-13-20    Duration of Dizziness minutes    Symptom Nature Positional    Aggravating Factors Looking up to the ceiling;Rolling to right    Relieving Factors Lying supine;Head stationary    Progression of Symptoms Better    History of similar episodes years ago per pt  report      Positional Testing   Dix-Hallpike Dix-Hallpike Right;Dix-Hallpike Left    Sidelying Test Sidelying Right;Sidelying Left      Dix-Hallpike Right   Dix-Hallpike Right Duration none    Dix-Hallpike Right Symptoms No nystagmus      Dix-Hallpike Left   Dix-Hallpike Left Duration none    Dix-Hallpike Left Symptoms No nystagmus      Sidelying Right   Sidelying Right Duration none    Sidelying Right Symptoms No nystagmus      Sidelying Left   Sidelying Left Duration none    Sidelying Left Symptoms No nystagmus              Objective measurements completed on examination: See above findings.               PT Education - 05/19/20 2041    Education Details info on etiology of BPPV given to pt - from [place]; also information on exs. for self tx    Person(s) Educated Patient    Methods Explanation;Demonstration;Handout     Comprehension Verbalized understanding;Returned demonstration              Pt was instructed in Epley maneuver and also in Brandt-Daroff exercises for self treatment prn should BPPV re-occur in future         Plan - 05/19/20 2043    Clinical Impression Statement Pt presents with no signs of BPPV at today's eval; all positional testing is (-) with no nystagmus and no c/o vertigo in test positions.  Pt appears to have episode of BPPV that has currently resolved.  Pt was instructed in Epley maneuver for self treatment prn.    Personal Factors and Comorbidities Comorbidity 1;Fitness    Examination-Activity Limitations Locomotion Level;Bed Mobility;Reach Overhead    Examination-Participation Restrictions Laundry;Meal Prep;Cleaning    Stability/Clinical Decision Making Stable/Uncomplicated    Clinical Decision Making Low    Rehab Potential Good    PT Frequency One time visit    PT Treatment/Interventions Vestibular;Patient/family education;ADLs/Self Care Home Management;Canalith Repostioning    PT Next Visit Plan N/A - eval only    Consulted and Agree with Plan of Care Patient           Patient will benefit from skilled therapeutic intervention in order to improve the following deficits and impairments:  Dizziness, Decreased balance  Visit Diagnosis: BPPV (benign paroxysmal positional vertigo), right - Plan: PT plan of care cert/re-cert     Problem List Patient Active Problem List   Diagnosis Date Noted  . Hot flashes due to tamoxifen 01/30/2014  . Malignant neoplasm of upper-outer quadrant of left breast in female, estrogen receptor positive ([facility]) 04/30/2013    Thoren Hosang, [place], PT 05/19/2020, 8:51 PM  [facility] [address] [place], [place], [address] Phone: [phone]   Fax:  [phone]  Name: [name] MRN: [id number] Date of Birth: 1955-11-12

## 2020-11-11 DIAGNOSIS — U071 COVID-19: Secondary | ICD-10-CM | POA: Diagnosis not present

## 2020-11-18 DIAGNOSIS — I10 Essential (primary) hypertension: Secondary | ICD-10-CM | POA: Diagnosis not present

## 2020-11-18 DIAGNOSIS — U071 COVID-19: Secondary | ICD-10-CM | POA: Diagnosis not present

## 2020-12-09 DIAGNOSIS — Z23 Encounter for immunization: Secondary | ICD-10-CM | POA: Diagnosis not present

## 2020-12-30 ENCOUNTER — Other Ambulatory Visit: Payer: Self-pay | Admitting: Physician Assistant

## 2020-12-30 DIAGNOSIS — R059 Cough, unspecified: Secondary | ICD-10-CM

## 2020-12-31 ENCOUNTER — Ambulatory Visit
Admission: RE | Admit: 2020-12-31 | Discharge: 2020-12-31 | Disposition: A | Discharge status: Home / Self Care | Payer: 59 | Source: Ambulatory Visit | Attending: Physician Assistant | Admitting: Physician Assistant

## 2020-12-31 DIAGNOSIS — R059 Cough, unspecified: Secondary | ICD-10-CM | POA: Diagnosis not present

## 2021-01-07 ENCOUNTER — Other Ambulatory Visit: Payer: Self-pay | Admitting: Internal Medicine

## 2021-01-07 ENCOUNTER — Ambulatory Visit
Admission: RE | Admit: 2021-01-07 | Discharge: 2021-01-07 | Disposition: A | Discharge status: Home / Self Care | Payer: BC Managed Care – PPO | Source: Ambulatory Visit | Attending: Internal Medicine | Admitting: Internal Medicine

## 2021-01-07 DIAGNOSIS — M19072 Primary osteoarthritis, left ankle and foot: Secondary | ICD-10-CM | POA: Diagnosis not present

## 2021-01-07 DIAGNOSIS — M25572 Pain in left ankle and joints of left foot: Secondary | ICD-10-CM | POA: Diagnosis not present

## 2021-01-07 DIAGNOSIS — R52 Pain, unspecified: Secondary | ICD-10-CM

## 2021-03-10 DIAGNOSIS — R519 Headache, unspecified: Secondary | ICD-10-CM | POA: Diagnosis not present

## 2021-04-08 ENCOUNTER — Other Ambulatory Visit: Payer: Self-pay | Admitting: Internal Medicine

## 2021-04-08 DIAGNOSIS — Z1231 Encounter for screening mammogram for malignant neoplasm of breast: Secondary | ICD-10-CM

## 2021-04-15 DIAGNOSIS — Z6830 Body mass index (BMI) 30.0-30.9, adult: Secondary | ICD-10-CM | POA: Diagnosis not present

## 2021-04-15 DIAGNOSIS — Z01419 Encounter for gynecological examination (general) (routine) without abnormal findings: Secondary | ICD-10-CM | POA: Diagnosis not present

## 2021-04-15 DIAGNOSIS — Z124 Encounter for screening for malignant neoplasm of cervix: Secondary | ICD-10-CM | POA: Diagnosis not present

## 2021-04-20 ENCOUNTER — Other Ambulatory Visit: Payer: Self-pay | Admitting: Obstetrics & Gynecology

## 2021-04-20 DIAGNOSIS — E2839 Other primary ovarian failure: Secondary | ICD-10-CM

## 2021-04-23 DIAGNOSIS — Z0000 Encounter for general adult medical examination without abnormal findings: Secondary | ICD-10-CM | POA: Diagnosis not present

## 2021-04-23 DIAGNOSIS — G4733 Obstructive sleep apnea (adult) (pediatric): Secondary | ICD-10-CM | POA: Diagnosis not present

## 2021-04-23 DIAGNOSIS — Z853 Personal history of malignant neoplasm of breast: Secondary | ICD-10-CM | POA: Diagnosis not present

## 2021-04-23 DIAGNOSIS — E7800 Pure hypercholesterolemia, unspecified: Secondary | ICD-10-CM | POA: Diagnosis not present

## 2021-04-23 DIAGNOSIS — Z23 Encounter for immunization: Secondary | ICD-10-CM | POA: Diagnosis not present

## 2021-04-23 DIAGNOSIS — I10 Essential (primary) hypertension: Secondary | ICD-10-CM | POA: Diagnosis not present

## 2021-05-03 DIAGNOSIS — G43009 Migraine without aura, not intractable, without status migrainosus: Secondary | ICD-10-CM | POA: Diagnosis not present

## 2021-05-03 DIAGNOSIS — R519 Headache, unspecified: Secondary | ICD-10-CM | POA: Diagnosis not present

## 2021-05-03 DIAGNOSIS — Z853 Personal history of malignant neoplasm of breast: Secondary | ICD-10-CM | POA: Diagnosis not present

## 2021-05-03 DIAGNOSIS — H538 Other visual disturbances: Secondary | ICD-10-CM | POA: Diagnosis not present

## 2021-05-05 DIAGNOSIS — G43809 Other migraine, not intractable, without status migrainosus: Secondary | ICD-10-CM | POA: Diagnosis not present

## 2021-05-07 ENCOUNTER — Other Ambulatory Visit (HOSPITAL_COMMUNITY): Payer: Self-pay | Admitting: Internal Medicine

## 2021-05-07 DIAGNOSIS — M19042 Primary osteoarthritis, left hand: Secondary | ICD-10-CM | POA: Diagnosis not present

## 2021-05-07 DIAGNOSIS — M79645 Pain in left finger(s): Secondary | ICD-10-CM | POA: Insufficient documentation

## 2021-05-07 DIAGNOSIS — I77810 Thoracic aortic ectasia: Secondary | ICD-10-CM

## 2021-05-12 ENCOUNTER — Ambulatory Visit
Admission: RE | Admit: 2021-05-12 | Discharge: 2021-05-12 | Disposition: A | Discharge status: Home / Self Care | Payer: PRIVATE HEALTH INSURANCE | Source: Ambulatory Visit | Attending: Internal Medicine | Admitting: Internal Medicine

## 2021-05-12 DIAGNOSIS — Z1231 Encounter for screening mammogram for malignant neoplasm of breast: Secondary | ICD-10-CM

## 2021-05-19 ENCOUNTER — Other Ambulatory Visit: Payer: Self-pay

## 2021-05-19 ENCOUNTER — Ambulatory Visit (INDEPENDENT_AMBULATORY_CARE_PROVIDER_SITE_OTHER): Payer: BC Managed Care – PPO

## 2021-05-19 DIAGNOSIS — I77810 Thoracic aortic ectasia: Secondary | ICD-10-CM | POA: Diagnosis not present

## 2021-05-24 LAB — ECHOCARDIOGRAM COMPLETE
AR max vel: 2.41 cm2
AV Area VTI: 2.48 cm2
AV Area mean vel: 2.33 cm2
AV Mean grad: 4 mmHg
AV Peak grad: 7.6 mmHg
Ao pk vel: 1.38 m/s
Area-P 1/2: 4.1 cm2
P 1/2 time: 443 msec
S' Lateral: 2.91 cm
Single Plane A2C EF: 61.8 %

## 2021-06-09 ENCOUNTER — Ambulatory Visit (HOSPITAL_BASED_OUTPATIENT_CLINIC_OR_DEPARTMENT_OTHER): Payer: PRIVATE HEALTH INSURANCE | Admitting: Cardiovascular Disease

## 2021-07-16 ENCOUNTER — Encounter: Payer: Self-pay | Admitting: Interventional Cardiology

## 2021-07-16 ENCOUNTER — Ambulatory Visit (INDEPENDENT_AMBULATORY_CARE_PROVIDER_SITE_OTHER): Payer: BC Managed Care – PPO | Admitting: Interventional Cardiology

## 2021-07-16 ENCOUNTER — Other Ambulatory Visit: Payer: Self-pay

## 2021-07-16 VITALS — BP 130/88 | HR 71 | Ht 64.0 in | Wt 173.0 lb

## 2021-07-16 DIAGNOSIS — I10 Essential (primary) hypertension: Secondary | ICD-10-CM

## 2021-07-16 DIAGNOSIS — E782 Mixed hyperlipidemia: Secondary | ICD-10-CM | POA: Diagnosis not present

## 2021-07-16 DIAGNOSIS — I719 Aortic aneurysm of unspecified site, without rupture: Secondary | ICD-10-CM | POA: Diagnosis not present

## 2021-07-16 DIAGNOSIS — Q231 Congenital insufficiency of aortic valve: Secondary | ICD-10-CM | POA: Diagnosis not present

## 2021-07-16 MED ORDER — METOPROLOL SUCCINATE ER 25 MG PO TB24: 25.0000 mg | ORAL_TABLET | Freq: Every day | ORAL | 3 refills | Status: DC

## 2021-07-16 NOTE — Patient Instructions (Addendum)
Medication Instructions:   Your physician has recommended you make the following change in your medication: Start Metoprolol Succinate 25 mg by mouth daily  *If you need a refill on your cardiac medications before your next appointment, please call your pharmacy*   Lab Work: Your physician recommends that you return for lab work in: late April.  Prior to CT Scan  If you have labs (blood work) drawn today and your tests are completely normal, you will receive your results only by: MyChart Message (if you have MyChart) OR A paper copy in the mail If you have any lab test that is abnormal or we need to change your treatment, we will call you to review the results.   Testing/Procedures: Non-Cardiac CT Angiography (CTA), is a special type of CT scan that uses a computer to produce multi-dimensional views of major blood vessels throughout the body. In CT angiography, a contrast material is injected through an IV to help visualize the blood vessels  To be done in May 2023  Your physician has requested that you have an echocardiogram. Echocardiography is a painless test that uses sound waves to create images of your heart. It provides your doctor with information about the size and shape of your heart and how well your hearts chambers and valves are working. This procedure takes approximately one hour. There are no restrictions for this procedure. To be done in one year prior to appointment with Dr Irish Lack   Follow-Up: At [facility], you and your health needs are our priority.  As part of our continuing mission to provide you with exceptional heart care, we have created designated Provider Care Teams.  These Care Teams include your primary Cardiologist (physician) and Advanced Practice Providers (APPs -  Physician Assistants and Nurse Practitioners) who all work together to provide you with the care you need, when you need it.  We recommend signing up for the patient portal called "MyChart".   Sign up information is provided on this After Visit Summary.  MyChart is used to connect with patients for Virtual Visits (Telemedicine).  Patients are able to view lab/test results, encounter notes, upcoming appointments, etc.  Non-urgent messages can be sent to your provider as well.   To learn more about what you can do with MyChart, go to [email or web address].    Your next appointment:   12 month(s)  The format for your next appointment:   In Person  Provider:   Larae Grooms, MD     Other Instructions You have been referred to [facility].

## 2021-07-16 NOTE — Progress Notes (Signed)
Cardiology Office Note   Date:  07/16/2021   ID:  Lively, Haberman 20-Jul-1955, MRN [id number]  PCP:  Lavone Orn, MD    Chief Complaint  Patient presents with   Establish Care    Wants to discuss ECHO   Bicuspid aortic valve  Wt Readings from Last 3 Encounters:  07/16/21 173 lb (78.5 kg)  08/09/19 173 lb 8 oz (78.7 kg)  02/21/18 171 lb 6.4 oz (77.7 kg)       History of Present Illness: [name] is a 66 y.o. female who is being seen today for the evaluation of ascending aortic dilatation/bicuspid aortic valve at the request of Lavone Orn, MD.   2022 echo showed: "Left ventricular ejection fraction, by estimation, is 55 to 60%. The  left ventricle has normal function. The left ventricle has no regional  wall motion abnormalities. Left ventricular diastolic parameters were  normal. The average left ventricular  global longitudinal strain is -13.8 %. The global longitudinal strain is  abnormal.   2. Right ventricular systolic function is normal. The right ventricular  size is normal.   3. The mitral valve is abnormal. Mild mitral valve regurgitation. No  evidence of mitral stenosis.   4. Likley bicuspid with fused right and left cusps . The aortic valve is bicuspid. There is mild calcification of the aortic valve. Aortic valve regurgitation is trivial. No aortic stenosis is present.   5. Aortic dilatation noted. There is severe dilatation of the aortic  root, measuring 46 mm. There is moderate dilatation of the ascending  aorta, measuring 44 mm.   6. The inferior vena cava is normal in size with greater than 50%  respiratory variability, suggesting right atrial pressure of 3 mmHg.   Comparison(s): EF 55%, mild LVH, AOR 37mm, no AI seen."  Denies : Chest pain. Dizziness. Leg edema. Nitroglycerin use. Orthopnea. Palpitations. Paroxysmal nocturnal dyspnea. Syncope.    Walks for exercise.  Has some DOE.  1-1.5 miles /day.  HR to 127.    Home  readings BP 120s/80s.    Resting HR is in the 60's.    Brother with aortic aneurysm.  Maternal aunt had surgery for aneurysm.  Brother died at 67.  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[facility])    Low back strain    Migraine    Non-recurrent acute suppurative otitis media of both ears without spontaneous rupture of tympanic membranes    OSA (obstructive sleep apnea)    Osteoarthritis    RIGHT HAND   Osteoarthritis of left ankle    Osteoarthritis of right knee    Otitis media    Personal history of radiation therapy 2014   PONV (postoperative nausea and vomiting)    Pure hypercholesterolemia    Radiation 02/18/13-04/04/13   left breast   Seasonal allergic rhinitis    Situational stress    Tinnitus  Wears glasses     Past Surgical History:  Procedure Laterality Date   BACK SURGERY     BREAST LUMPECTOMY Left 12/31/2012   BREAST LUMPECTOMY WITH NEEDLE LOCALIZATION AND AXILLARY SENTINEL LYMPH NODE BX Left 12/31/2012   Procedure: LEFT BREAST LUMPECTOMY WITH NEEDLE LOCALIZATION AND LEFT AXILLARY SENTINEL LYMPH NODE BIOPSY;  Surgeon: Shann Medal, MD;  Location: [facility];  Service: General;  Laterality: Left;   COLONOSCOPY  2007   MASS EXCISION Right 08/09/2019   Procedure: EXCISION MUCOID TUMOR, DEBRIDEMENT DISTAL INTERPHALANGEAL RIGHT INDEX FINGER;  Surgeon: Daryll Brod, MD;  Location: [facility];  Service: Orthopedics;  Laterality: Right;  IV REGIONAL FOREARM BIER BLOCK   NASAL POLYP SURGERY      WISDOM TOOTH EXTRACTION       Current Outpatient Medications  Medication Sig Dispense Refill   ALPRAZolam (XANAX) 0.25 MG tablet Take 1 tablet by mouth 3 (three) times daily as needed. Patient states she only uses as needed     aspirin 81 MG chewable tablet Chew 81 mg by mouth daily.      atorvastatin (LIPITOR) 10 MG tablet Take 10 mg by mouth daily.     Cholecalciferol (VITAMIN D3) 1.25 MG (50000 UT) CAPS Take 1 capsule by mouth daily.     fluticasone (FLONASE) 50 MCG/ACT nasal spray Place 1 spray into both nostrils daily.     Loratadine 10 MG CAPS Take 10 mg by mouth as needed.     Multiple Vitamins-Minerals (CENTRUM SILVER 50+WOMEN PO) Take 1 tablet by mouth daily.     traMADol (ULTRAM) 50 MG tablet Take 1 tablet (50 mg total) by mouth every 6 (six) hours as needed. 20 tablet 0   valsartan (DIOVAN) 80 MG tablet Take 80 mg by mouth daily.     No current facility-administered medications for this visit.    Allergies:   Patient has no known allergies.    Social History:  The patient  reports that she has never smoked. She has never used smokeless tobacco. She reports current alcohol use. She reports that she does not use drugs.   Family History:  The patient's family history includes Brain cancer in her paternal grandfather; Breast cancer (age of onset: 64) in her cousin and cousin; Melanoma in her daughter; Uterine cancer in her mother.    ROS:  Please see the history of present illness.   Otherwise, review of systems are positive for DOE- stable.   All other systems are reviewed and negative.    PHYSICAL EXAM: VS:  BP 130/88    Pulse 71    Ht 5\' 4"  (1.626 m)    Wt 173 lb (78.5 kg)    SpO2 99%    BMI 29.70 kg/m  , BMI Body mass index is 29.7 kg/m. GEN: Well nourished, well developed, in no acute distress HEENT: normal Neck: no JVD, carotid bruits, or masses Cardiac: RRR; 1/6 early systolic murmur, no rubs, or gallops,no edema  Respiratory:  clear to auscultation bilaterally,  normal work of breathing GI: soft, nontender, nondistended, + BS MS: no deformity or atrophy Skin: warm and dry, no rash Neuro:  Strength and sensation are intact Psych: euthymic mood, full affect   EKG:   The ekg ordered today demonstrates normal sinus rhythm, nonspecific ST T wave flattening diffusely.   Recent Labs: No results found for requested labs within last 8760 hours.   Lipid Panel No results found for: CHOL, TRIG, HDL, CHOLHDL, VLDL, LDLCALC, LDLDIRECT  Other studies Reviewed: Additional studies/ records that were reviewed today with results demonstrating: Labs and other records reviewed..   ASSESSMENT AND PLAN:  Bicuspid aortic valve/thoracic aortic aneurysm: Valve function is adequate at this time.  Given bicuspid valve, will need close follow-up of her thoracic aorta.  Plan for CTA in May 2023.  Plan for echo in a year to look at her valve.  We will also refer to CT surgery if so she is in the system.  We discussed avoiding heavy lifting and straining.  We will start low-dose beta-blocker to reduce shear stress.  We discussed symptoms of aortic dissection. Hypertension: Home readings are well controlled.  We discussed the importance of blood pressure control to prevent enlargement of the aneurysm. Hyperlipidemia: Continue atorvastatin.  LDL 99 in October 2022.  Whole food, plant-based diet recommended.  High-fiber diet.   Current medicines are reviewed at length with the patient today.  The patient concerns regarding her medicines were addressed.  The following changes have been made: Add metoprolol  Labs/ tests ordered today include: As above No orders of the defined types were placed in this encounter.   Recommend 150 minutes/week of aerobic exercise Low fat, low carb, high fiber diet recommended  Disposition:   FU in 1 year   Signed, Larae Grooms, MD  07/16/2021 10:06 AM    [facility] [place], [place], [place]   [address] Phone: [phone]; Fax: [phone]

## 2021-09-13 DIAGNOSIS — M25561 Pain in right knee: Secondary | ICD-10-CM | POA: Diagnosis not present

## 2021-09-13 DIAGNOSIS — M25572 Pain in left ankle and joints of left foot: Secondary | ICD-10-CM | POA: Diagnosis not present

## 2021-09-21 DIAGNOSIS — R269 Unspecified abnormalities of gait and mobility: Secondary | ICD-10-CM | POA: Diagnosis not present

## 2021-09-21 DIAGNOSIS — M25572 Pain in left ankle and joints of left foot: Secondary | ICD-10-CM | POA: Diagnosis not present

## 2021-09-21 DIAGNOSIS — M6281 Muscle weakness (generalized): Secondary | ICD-10-CM | POA: Diagnosis not present

## 2021-09-21 DIAGNOSIS — M25561 Pain in right knee: Secondary | ICD-10-CM | POA: Diagnosis not present

## 2021-09-21 DIAGNOSIS — M1711 Unilateral primary osteoarthritis, right knee: Secondary | ICD-10-CM | POA: Diagnosis not present

## 2021-09-22 ENCOUNTER — Ambulatory Visit
Admission: RE | Admit: 2021-09-22 | Discharge: 2021-09-22 | Disposition: A | Discharge status: Home / Self Care | Payer: BC Managed Care – PPO | Source: Ambulatory Visit | Attending: Obstetrics & Gynecology | Admitting: Obstetrics & Gynecology

## 2021-09-22 DIAGNOSIS — E2839 Other primary ovarian failure: Secondary | ICD-10-CM

## 2021-09-22 DIAGNOSIS — M85852 Other specified disorders of bone density and structure, left thigh: Secondary | ICD-10-CM | POA: Diagnosis not present

## 2021-09-22 DIAGNOSIS — Z780 Asymptomatic menopausal state: Secondary | ICD-10-CM | POA: Diagnosis not present

## 2021-09-28 DIAGNOSIS — R269 Unspecified abnormalities of gait and mobility: Secondary | ICD-10-CM | POA: Diagnosis not present

## 2021-09-28 DIAGNOSIS — M6281 Muscle weakness (generalized): Secondary | ICD-10-CM | POA: Diagnosis not present

## 2021-09-28 DIAGNOSIS — M25561 Pain in right knee: Secondary | ICD-10-CM | POA: Diagnosis not present

## 2021-09-28 DIAGNOSIS — M25572 Pain in left ankle and joints of left foot: Secondary | ICD-10-CM | POA: Diagnosis not present

## 2021-09-30 DIAGNOSIS — R269 Unspecified abnormalities of gait and mobility: Secondary | ICD-10-CM | POA: Diagnosis not present

## 2021-09-30 DIAGNOSIS — M25561 Pain in right knee: Secondary | ICD-10-CM | POA: Diagnosis not present

## 2021-09-30 DIAGNOSIS — M6281 Muscle weakness (generalized): Secondary | ICD-10-CM | POA: Diagnosis not present

## 2021-09-30 DIAGNOSIS — M25572 Pain in left ankle and joints of left foot: Secondary | ICD-10-CM | POA: Diagnosis not present

## 2021-10-05 DIAGNOSIS — M6281 Muscle weakness (generalized): Secondary | ICD-10-CM | POA: Diagnosis not present

## 2021-10-05 DIAGNOSIS — R269 Unspecified abnormalities of gait and mobility: Secondary | ICD-10-CM | POA: Diagnosis not present

## 2021-10-05 DIAGNOSIS — M25561 Pain in right knee: Secondary | ICD-10-CM | POA: Diagnosis not present

## 2021-10-05 DIAGNOSIS — M25572 Pain in left ankle and joints of left foot: Secondary | ICD-10-CM | POA: Diagnosis not present

## 2021-10-07 DIAGNOSIS — M6281 Muscle weakness (generalized): Secondary | ICD-10-CM | POA: Diagnosis not present

## 2021-10-07 DIAGNOSIS — R269 Unspecified abnormalities of gait and mobility: Secondary | ICD-10-CM | POA: Diagnosis not present

## 2021-10-07 DIAGNOSIS — M25572 Pain in left ankle and joints of left foot: Secondary | ICD-10-CM | POA: Diagnosis not present

## 2021-10-07 DIAGNOSIS — M25561 Pain in right knee: Secondary | ICD-10-CM | POA: Diagnosis not present

## 2021-10-11 DIAGNOSIS — M6281 Muscle weakness (generalized): Secondary | ICD-10-CM | POA: Diagnosis not present

## 2021-10-11 DIAGNOSIS — R269 Unspecified abnormalities of gait and mobility: Secondary | ICD-10-CM | POA: Diagnosis not present

## 2021-10-11 DIAGNOSIS — M25561 Pain in right knee: Secondary | ICD-10-CM | POA: Diagnosis not present

## 2021-10-11 DIAGNOSIS — M25572 Pain in left ankle and joints of left foot: Secondary | ICD-10-CM | POA: Diagnosis not present

## 2021-10-13 DIAGNOSIS — M25561 Pain in right knee: Secondary | ICD-10-CM | POA: Diagnosis not present

## 2021-10-13 DIAGNOSIS — M25572 Pain in left ankle and joints of left foot: Secondary | ICD-10-CM | POA: Diagnosis not present

## 2021-10-13 DIAGNOSIS — M6281 Muscle weakness (generalized): Secondary | ICD-10-CM | POA: Diagnosis not present

## 2021-10-13 DIAGNOSIS — R269 Unspecified abnormalities of gait and mobility: Secondary | ICD-10-CM | POA: Diagnosis not present

## 2021-10-19 DIAGNOSIS — R269 Unspecified abnormalities of gait and mobility: Secondary | ICD-10-CM | POA: Diagnosis not present

## 2021-10-19 DIAGNOSIS — M25561 Pain in right knee: Secondary | ICD-10-CM | POA: Diagnosis not present

## 2021-10-19 DIAGNOSIS — M25572 Pain in left ankle and joints of left foot: Secondary | ICD-10-CM | POA: Diagnosis not present

## 2021-10-19 DIAGNOSIS — M6281 Muscle weakness (generalized): Secondary | ICD-10-CM | POA: Diagnosis not present

## 2021-10-21 DIAGNOSIS — M6281 Muscle weakness (generalized): Secondary | ICD-10-CM | POA: Diagnosis not present

## 2021-10-21 DIAGNOSIS — M25572 Pain in left ankle and joints of left foot: Secondary | ICD-10-CM | POA: Diagnosis not present

## 2021-10-21 DIAGNOSIS — R269 Unspecified abnormalities of gait and mobility: Secondary | ICD-10-CM | POA: Diagnosis not present

## 2021-10-21 DIAGNOSIS — M25561 Pain in right knee: Secondary | ICD-10-CM | POA: Diagnosis not present

## 2021-10-22 DIAGNOSIS — I77810 Thoracic aortic ectasia: Secondary | ICD-10-CM | POA: Diagnosis not present

## 2021-10-22 DIAGNOSIS — I10 Essential (primary) hypertension: Secondary | ICD-10-CM | POA: Diagnosis not present

## 2021-10-22 DIAGNOSIS — Q231 Congenital insufficiency of aortic valve: Secondary | ICD-10-CM | POA: Diagnosis not present

## 2021-10-22 DIAGNOSIS — I7121 Aneurysm of the ascending aorta, without rupture: Secondary | ICD-10-CM | POA: Diagnosis not present

## 2021-10-26 DIAGNOSIS — M6281 Muscle weakness (generalized): Secondary | ICD-10-CM | POA: Diagnosis not present

## 2021-10-26 DIAGNOSIS — M25572 Pain in left ankle and joints of left foot: Secondary | ICD-10-CM | POA: Diagnosis not present

## 2021-10-26 DIAGNOSIS — R269 Unspecified abnormalities of gait and mobility: Secondary | ICD-10-CM | POA: Diagnosis not present

## 2021-10-26 DIAGNOSIS — M25561 Pain in right knee: Secondary | ICD-10-CM | POA: Diagnosis not present

## 2021-10-28 DIAGNOSIS — M25572 Pain in left ankle and joints of left foot: Secondary | ICD-10-CM | POA: Diagnosis not present

## 2021-10-28 DIAGNOSIS — M6281 Muscle weakness (generalized): Secondary | ICD-10-CM | POA: Diagnosis not present

## 2021-10-28 DIAGNOSIS — R269 Unspecified abnormalities of gait and mobility: Secondary | ICD-10-CM | POA: Diagnosis not present

## 2021-10-28 DIAGNOSIS — M25561 Pain in right knee: Secondary | ICD-10-CM | POA: Diagnosis not present

## 2021-11-02 DIAGNOSIS — M6281 Muscle weakness (generalized): Secondary | ICD-10-CM | POA: Diagnosis not present

## 2021-11-02 DIAGNOSIS — M25561 Pain in right knee: Secondary | ICD-10-CM | POA: Diagnosis not present

## 2021-11-02 DIAGNOSIS — M25572 Pain in left ankle and joints of left foot: Secondary | ICD-10-CM | POA: Diagnosis not present

## 2021-11-02 DIAGNOSIS — R269 Unspecified abnormalities of gait and mobility: Secondary | ICD-10-CM | POA: Diagnosis not present

## 2021-11-04 ENCOUNTER — Other Ambulatory Visit: Payer: BC Managed Care – PPO

## 2021-11-04 DIAGNOSIS — I719 Aortic aneurysm of unspecified site, without rupture: Secondary | ICD-10-CM

## 2021-11-04 DIAGNOSIS — M25572 Pain in left ankle and joints of left foot: Secondary | ICD-10-CM | POA: Diagnosis not present

## 2021-11-04 DIAGNOSIS — M6281 Muscle weakness (generalized): Secondary | ICD-10-CM | POA: Diagnosis not present

## 2021-11-04 DIAGNOSIS — M25561 Pain in right knee: Secondary | ICD-10-CM | POA: Diagnosis not present

## 2021-11-04 DIAGNOSIS — Q231 Congenital insufficiency of aortic valve: Secondary | ICD-10-CM | POA: Diagnosis not present

## 2021-11-04 DIAGNOSIS — R269 Unspecified abnormalities of gait and mobility: Secondary | ICD-10-CM | POA: Diagnosis not present

## 2021-11-04 LAB — BASIC METABOLIC PANEL
BUN/Creatinine Ratio: 22 (ref 12–28)
BUN: 17 mg/dL (ref 8–27)
CO2: 25 mmol/L (ref 20–29)
Calcium: 9.6 mg/dL (ref 8.7–10.3)
Chloride: 103 mmol/L (ref 96–106)
Creatinine, Ser: 0.77 mg/dL (ref 0.57–1.00)
Glucose: 84 mg/dL (ref 70–99)
Potassium: 4.4 mmol/L (ref 3.5–5.2)
Sodium: 140 mmol/L (ref 134–144)
eGFR: 86 mL/min/{1.73_m2} (ref >59)

## 2021-11-09 DIAGNOSIS — R269 Unspecified abnormalities of gait and mobility: Secondary | ICD-10-CM | POA: Diagnosis not present

## 2021-11-09 DIAGNOSIS — M6281 Muscle weakness (generalized): Secondary | ICD-10-CM | POA: Diagnosis not present

## 2021-11-09 DIAGNOSIS — M25572 Pain in left ankle and joints of left foot: Secondary | ICD-10-CM | POA: Diagnosis not present

## 2021-11-09 DIAGNOSIS — M25561 Pain in right knee: Secondary | ICD-10-CM | POA: Diagnosis not present

## 2021-11-11 ENCOUNTER — Ambulatory Visit (INDEPENDENT_AMBULATORY_CARE_PROVIDER_SITE_OTHER)
Admission: RE | Admit: 2021-11-11 | Discharge: 2021-11-11 | Disposition: A | Discharge status: Home / Self Care | Payer: BC Managed Care – PPO | Source: Ambulatory Visit | Attending: Interventional Cardiology | Admitting: Interventional Cardiology

## 2021-11-11 DIAGNOSIS — Q231 Congenital insufficiency of aortic valve: Secondary | ICD-10-CM | POA: Diagnosis not present

## 2021-11-11 DIAGNOSIS — R269 Unspecified abnormalities of gait and mobility: Secondary | ICD-10-CM | POA: Diagnosis not present

## 2021-11-11 DIAGNOSIS — M25561 Pain in right knee: Secondary | ICD-10-CM | POA: Diagnosis not present

## 2021-11-11 DIAGNOSIS — I719 Aortic aneurysm of unspecified site, without rupture: Secondary | ICD-10-CM | POA: Diagnosis not present

## 2021-11-11 DIAGNOSIS — M6281 Muscle weakness (generalized): Secondary | ICD-10-CM | POA: Diagnosis not present

## 2021-11-11 DIAGNOSIS — I7121 Aneurysm of the ascending aorta, without rupture: Secondary | ICD-10-CM | POA: Diagnosis not present

## 2021-11-11 DIAGNOSIS — M25572 Pain in left ankle and joints of left foot: Secondary | ICD-10-CM | POA: Diagnosis not present

## 2021-11-11 MED ORDER — IOHEXOL 350 MG/ML SOLN
100.0000 mL | Freq: Once | INTRAVENOUS | Status: AC | PRN
Start: 2021-11-11 — End: 2021-11-11
  Administered 2021-11-11: 100 mL via INTRAVENOUS

## 2021-11-15 ENCOUNTER — Institutional Professional Consult (permissible substitution): Payer: BC Managed Care – PPO | Admitting: Physician Assistant

## 2021-11-15 VITALS — BP 142/86 | HR 74 | Resp 20 | Ht 64.0 in | Wt 170.0 lb

## 2021-11-15 DIAGNOSIS — M25572 Pain in left ankle and joints of left foot: Secondary | ICD-10-CM | POA: Diagnosis not present

## 2021-11-15 DIAGNOSIS — R269 Unspecified abnormalities of gait and mobility: Secondary | ICD-10-CM | POA: Diagnosis not present

## 2021-11-15 DIAGNOSIS — I77810 Thoracic aortic ectasia: Secondary | ICD-10-CM

## 2021-11-15 DIAGNOSIS — M25561 Pain in right knee: Secondary | ICD-10-CM | POA: Diagnosis not present

## 2021-11-15 DIAGNOSIS — I712 Thoracic aortic aneurysm, without rupture, unspecified: Secondary | ICD-10-CM

## 2021-11-15 DIAGNOSIS — M6281 Muscle weakness (generalized): Secondary | ICD-10-CM | POA: Diagnosis not present

## 2021-11-15 NOTE — Patient Instructions (Signed)
Patient is counseled regarding the importance of long term risk factor modification as they pertain to the presence of ischemic heart disease including avoiding the use of all tobacco products, dietary modifications and medical therapy for diabetes, cholesterol and lipid management, and regular exercise.   ? ? ?AVOID USE OF FLUOROQUINOLONES AS THIS CLASS OF DRUGS CAN INCREASE YOUR RISK OF AORTIC DISSECTION ?

## 2021-11-15 NOTE — Progress Notes (Signed)
? ?   ?[place].[address] ?      [place] [address] ?            [phone]   ? ?  ? ? ?[name] ?[id number] ?12/18/1955 ? ?History of Present Illness: ? ?[name] is a 66 yo female with history of Anxiety, Obesity, HTN, Breast Cancer, Carotid Stenosis with a dissection of internal carotid artery, and HLD.  She was referred to Dr. Irish Lack by her primary care physician for Aortic Root Enlargement.  The patient also wished to discuss her Echocardiogram results from 2022.  This showed the patient to have a bicuspid Aortic Valve with fusion of the right and left coronary cusps.  There was no evidence of valvular abnormality at that time.  She has a family history of Aortic aneurysm for which her brother died at the age of 57 and her aunt required surgical intervention.  Currently the patient is doing well.  She denies chest pain, shortness of breath, and palpitations.  She does state she is recovering from Tendonitis so she has not been as active lately.  She states her aneurysm was incidentally found after her brother was noted to have a bicuspid valve and they were instructed that should screen the siblings.  Her blood pressure is well controlled.  She has never smoked. ? ?Current Outpatient Medications on File Prior to Visit  ?Medication Sig Dispense Refill  ? ALPRAZolam (XANAX) 0.25 MG tablet Take 1 tablet by mouth 3 (three) times daily as needed. Patient states she only uses as needed    ? aspirin 81 MG chewable tablet Chew 81 mg by mouth daily.     ? atorvastatin (LIPITOR) 10 MG tablet Take 10 mg by mouth daily.    ? Cholecalciferol (VITAMIN D3) 1.25 MG (50000 UT) CAPS Take 1 capsule by mouth daily.    ? fluticasone (FLONASE) 50 MCG/ACT nasal spray Place 1 spray into both nostrils daily.    ? Loratadine 10 MG CAPS Take 10 mg by mouth as needed.    ? metoprolol succinate (TOPROL XL) 25 MG 24 hr tablet Take 1 tablet (25 mg total) by mouth daily. 90 tablet 3  ? Multiple Vitamins-Minerals  (CENTRUM SILVER 50+WOMEN PO) Take 1 tablet by mouth daily.    ? valsartan (DIOVAN) 80 MG tablet Take 80 mg by mouth daily.    ? ?No current facility-administered medications on file prior to visit.  ? ? ? ?BP (!) 142/86 (BP Location: Left Arm, Patient Position: Sitting)   Pulse 74   Resp 20   Ht '5\' 4"'$  (1.626 m)   Wt 170 lb (77.1 kg)   SpO2 98% Comment: RA  BMI 29.18 kg/m?  ? ?Physical Exam ? ?Gen: NAD ?Heart: RRR ?Lungs: CTA bilaterally ?Abd: soft non-tender, non-distended ?Ext: no edema ?Neck: no bruit ?Neuro: grossly intact ? ? ?ECHO: ? ?IMPRESSIONS  ? ? 1. Left ventricular ejection fraction, by estimation, is 55 to 60%. The  ?left ventricle has normal function. The left ventricle has no regional  ?wall motion abnormalities. Left ventricular diastolic parameters were  ?normal. The average left ventricular  ?global longitudinal strain is -13.8 %. The global longitudinal strain is  ?abnormal.  ? 2. Right ventricular systolic function is normal. The right ventricular  ?size is normal.  ? 3. The mitral valve is abnormal. Mild mitral valve regurgitation. No  ?evidence of mitral stenosis.  ? 4. Likley bicuspid with fused right and left cusps . The aortic  valve is  ?bicuspid. There is mild calcification of the aortic valve. Aortic valve  ?regurgitation is trivial. No aortic stenosis is present.  ? 5. Aortic dilatation noted. There is severe dilatation of the aortic  ?root, measuring 46 mm. There is moderate dilatation of the ascending  ?aorta, measuring 44 mm.  ? 6. The inferior vena cava is normal in size with greater than 50%  ?respiratory variability, suggesting right atrial pressure of 3 mmHg.  ? ? ?CTA Results: ? ?FINDINGS: ?Cardiovascular: Preferential opacification of the thoracic aorta. ?The aortic root measures up to 4.7 cm. Borderline ascending thoracic ?aortic aneurysm measures 4.0 cm (see key images). The pulmonary ?arteries are normal in caliber without central filling defect. ?Normal heart size. No  pericardial effusion. ?  ?Mediastinum/Nodes: No enlarged mediastinal, hilar, or axillary lymph ?nodes. The thyroid gland appears normal. ?  ?Lungs/Pleura: No pleural effusion. No pneumothorax. No mass or focal ?consolidation. No suspicious pulmonary nodules. ?  ?Musculoskeletal: No aggressive osseous lesions. ?  ?Upper abdomen: Probable tiny hiatal hernia. 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 ?  ?Review of the MIP images confirms the above findings. ?  ?IMPRESSION: ?1. The aortic root measures 4.7 cm. ?2. Borderline aneurysm of the ascending thoracic aorta measures 4.0 ?cm. Recommend annual imaging followup by CTA or MRA. This ?recommendation follows 2010 ?ACCF/AHA/AATS/ACR/ASA/SCA/SCAI/SIR/STS/SVM Guidelines for the ?Diagnosis and Management of Patients with Thoracic Aortic Disease. ?Circulation. 2010; 121: W979-G921. Aortic aneurysm NOS (ICD10-I71.9) ?3. Other ancillary findings as described. ?   ?Electronically Signed ?  By: Albin Felling M.D. ?  On: 11/11/2021 12:37 ? ? ? ?A/P: ? ?Aortic Root Dilatation measuring 4.7 cm- will need close surveillance with repeat CTA in 6 months.. Patient has significant family history with brother passing away at 77 and aunt requiring surgical intervention ?Bicuspid Aortic Valve- no evidence of valvular dysfunction at this time.. plan for repeat ECHO in 1 year per [place] note, may need to be done sooner if symptoms arise ?HTN- mildly elevated today, normally runs in the 120s.. instructed patient that should she notices this is remaining elevated she would need to see her PCP for medication adjustment currently on Toprol XL, Diovan ?HLD-continue Lipitor ? ? ?Risk Modification: ? ?Statin:  on Lipitor ? ?Smoking cessation instruction/counseling given:   ?Never Smoker ? ?Patient was counseled on  importance of Blood Pressure Control.  Despite Medical intervention if the patient notices persistently elevated blood pressure readings.  They are instructed to contact their Primary Care Physician ? ?Please avoid use of Fluoroquinolones as this can potentially increase your risk of Aortic Rupture and/or Dissection ? ?Patient educated on signs and symptoms of Aortic Dissection, handout also provided in AVS ? ?Ellwood Handler, PA-C ?11/15/21 ? ? ? ? ?

## 2021-11-17 DIAGNOSIS — M25572 Pain in left ankle and joints of left foot: Secondary | ICD-10-CM | POA: Diagnosis not present

## 2021-11-17 DIAGNOSIS — M25561 Pain in right knee: Secondary | ICD-10-CM | POA: Diagnosis not present

## 2021-11-17 DIAGNOSIS — M6281 Muscle weakness (generalized): Secondary | ICD-10-CM | POA: Diagnosis not present

## 2021-11-17 DIAGNOSIS — R269 Unspecified abnormalities of gait and mobility: Secondary | ICD-10-CM | POA: Diagnosis not present

## 2021-11-22 DIAGNOSIS — M25572 Pain in left ankle and joints of left foot: Secondary | ICD-10-CM | POA: Diagnosis not present

## 2022-03-08 DIAGNOSIS — R1030 Lower abdominal pain, unspecified: Secondary | ICD-10-CM | POA: Diagnosis not present

## 2022-03-09 ENCOUNTER — Other Ambulatory Visit: Payer: Self-pay | Admitting: Internal Medicine

## 2022-03-09 DIAGNOSIS — R109 Unspecified abdominal pain: Secondary | ICD-10-CM | POA: Diagnosis not present

## 2022-03-09 DIAGNOSIS — R1031 Right lower quadrant pain: Secondary | ICD-10-CM | POA: Diagnosis not present

## 2022-03-10 ENCOUNTER — Ambulatory Visit
Admission: RE | Admit: 2022-03-10 | Discharge: 2022-03-10 | Disposition: A | Discharge status: Home / Self Care | Payer: BC Managed Care – PPO | Source: Ambulatory Visit | Attending: Internal Medicine | Admitting: Internal Medicine

## 2022-03-10 DIAGNOSIS — K760 Fatty (change of) liver, not elsewhere classified: Secondary | ICD-10-CM | POA: Diagnosis not present

## 2022-03-10 DIAGNOSIS — R109 Unspecified abdominal pain: Secondary | ICD-10-CM

## 2022-03-10 DIAGNOSIS — K5732 Diverticulitis of large intestine without perforation or abscess without bleeding: Secondary | ICD-10-CM | POA: Diagnosis not present

## 2022-03-10 DIAGNOSIS — K5900 Constipation, unspecified: Secondary | ICD-10-CM | POA: Diagnosis not present

## 2022-03-10 DIAGNOSIS — K6389 Other specified diseases of intestine: Secondary | ICD-10-CM | POA: Diagnosis not present

## 2022-03-10 MED ORDER — IOPAMIDOL (ISOVUE-300) INJECTION 61%
100.0000 mL | Freq: Once | INTRAVENOUS | Status: AC | PRN
  Administered 2022-03-10: 100 mL via INTRAVENOUS

## 2022-03-21 DIAGNOSIS — Z8719 Personal history of other diseases of the digestive system: Secondary | ICD-10-CM | POA: Diagnosis not present

## 2022-03-25 ENCOUNTER — Telehealth: Payer: Self-pay

## 2022-03-25 NOTE — Telephone Encounter (Signed)
S/w the pt in regard to in office appt and CTA. Pt states Dr. Cyndia Bent office was doing the CTA and they will set that up I do see there is an order in [facility] entered by Ellwood Handler, PAC.   Pt has been scheduled to see Ambrose Pancoast, NP 05/16/22 @ 8:50 pre op clearance.

## 2022-03-25 NOTE — Telephone Encounter (Signed)
   Name: [name]  DOB: [birth year]  MRN: [id number]  Primary Cardiologist: Larae Grooms, MD  Chart reviewed as part of pre-operative protocol coverage. Because of [name]'s past medical history and time since last visit, she will require a follow-up in-office visit in order to better assess preoperative cardiovascular risk.  Pre-op covering staff: - Please schedule appointment and call patient to inform them. If patient already had an upcoming appointment within acceptable timeframe, please add "pre-op clearance" to the appointment notes so provider is aware. - Please contact requesting surgeon's office via preferred method (i.e, phone, fax) to inform them of need for appointment prior to surgery.  Decision on ASA hold can be made following in-person appointment. She will need surveillance CTA before clearance can be granted.   Elgie Collard, PA-C  03/25/2022, 1:54 PM

## 2022-03-25 NOTE — Telephone Encounter (Signed)
   Pre-operative Risk Assessment    Patient Name: [name]  DOB: 1956-04-30 MRN: [id number]      Request for Surgical Clearance    Procedure:   COLONSCOPY  Date of Surgery:  Clearance 06/01/22                                 Surgeon:  DR Ronnette Juniper Surgeon's Group or Practice Name:  [facility] Phone number:  [phone] Fax number:  [phone]   Type of Clearance Requested:   - Medical , ASA   Type of Anesthesia:  General PROPOLOL   Additional requests/questions:    [name]   03/25/2022, 1:43 PM

## 2022-04-12 ENCOUNTER — Other Ambulatory Visit: Payer: Self-pay | Admitting: Surgery

## 2022-04-12 DIAGNOSIS — I7121 Aneurysm of the ascending aorta, without rupture: Secondary | ICD-10-CM

## 2022-04-13 ENCOUNTER — Other Ambulatory Visit: Payer: Self-pay | Admitting: Internal Medicine

## 2022-04-13 DIAGNOSIS — Z1231 Encounter for screening mammogram for malignant neoplasm of breast: Secondary | ICD-10-CM

## 2022-04-29 DIAGNOSIS — Z0000 Encounter for general adult medical examination without abnormal findings: Secondary | ICD-10-CM | POA: Diagnosis not present

## 2022-04-29 DIAGNOSIS — E7800 Pure hypercholesterolemia, unspecified: Secondary | ICD-10-CM | POA: Diagnosis not present

## 2022-04-29 DIAGNOSIS — I10 Essential (primary) hypertension: Secondary | ICD-10-CM | POA: Diagnosis not present

## 2022-04-29 DIAGNOSIS — M8580 Other specified disorders of bone density and structure, unspecified site: Secondary | ICD-10-CM | POA: Diagnosis not present

## 2022-04-29 DIAGNOSIS — Z23 Encounter for immunization: Secondary | ICD-10-CM | POA: Diagnosis not present

## 2022-04-29 DIAGNOSIS — Z79899 Other long term (current) drug therapy: Secondary | ICD-10-CM | POA: Diagnosis not present

## 2022-05-11 DIAGNOSIS — H25813 Combined forms of age-related cataract, bilateral: Secondary | ICD-10-CM | POA: Diagnosis not present

## 2022-05-11 DIAGNOSIS — H527 Unspecified disorder of refraction: Secondary | ICD-10-CM | POA: Diagnosis not present

## 2022-05-15 NOTE — Progress Notes (Unsigned)
Office Visit    Patient Name: [name] Date of Encounter: 05/15/2022  Primary Care Provider:  Lavone Orn, MD Primary Cardiologist:  Larae Grooms, MD Primary Electrophysiologist: None  Chief Complaint    [name] is a 66 y.o. 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  Past Medical History    Past Medical History:  Diagnosis Date   Anemia    Anxiety    Bicuspid aortic valve    Breast cancer ([place]) 12/12/12 bx   left breast   Carotid artery stenosis    hx-dr dohmier monitors   COVID    Dilated aortic root (HCC)    DJD (degenerative joint disease) of knee    Dry eyes    Fibromuscular dysplasia (HCC)    GERD (gastroesophageal reflux disease)    Herpes simplex    History of breast cancer    HTN (hypertension)    IBS (irritable bowel syndrome)    Internal carotid artery dissection (HCC)    Low back strain    Migraine    Non-recurrent acute suppurative otitis media of both ears without spontaneous rupture of tympanic membranes    OSA (obstructive sleep apnea)    Osteoarthritis    RIGHT HAND   Osteoarthritis of left ankle    Osteoarthritis of right knee    Otitis media    Personal history of radiation therapy 2014   PONV (postoperative nausea and vomiting)    Pure hypercholesterolemia    Radiation 02/18/13-04/04/13   left breast   Seasonal allergic rhinitis    Situational stress    Tinnitus    Wears glasses    Past Surgical History:  Procedure Laterality Date   BACK SURGERY     BREAST LUMPECTOMY Left 12/31/2012   BREAST LUMPECTOMY WITH NEEDLE LOCALIZATION AND AXILLARY SENTINEL LYMPH NODE BX Left 12/31/2012   Procedure: LEFT BREAST LUMPECTOMY WITH NEEDLE LOCALIZATION AND LEFT AXILLARY SENTINEL LYMPH NODE BIOPSY;  Surgeon: Shann Medal, MD;  Location:  [place];  Service: General;  Laterality: Left;   COLONOSCOPY  2007   MASS EXCISION Right 08/09/2019   Procedure: EXCISION MUCOID TUMOR, DEBRIDEMENT DISTAL INTERPHALANGEAL RIGHT INDEX FINGER;  Surgeon: Daryll Brod, MD;  Location: [place];  Service: Orthopedics;  Laterality: Right;  IV REGIONAL FOREARM BIER BLOCK   NASAL POLYP SURGERY     WISDOM TOOTH EXTRACTION      Allergies  No Known Allergies  History of Present Illness    [name]  is a 66 year old female with the above mention past medical history who presents today for preop clearance for upcoming colonoscopy.  She was recently seen by Dr. Irish Lack in January of this year by referral of her PCP for evaluation of ascending aortic dilation and bicuspid AVR.  2D echo was completed in 2022 that showed EF of 55-60% with normal LV function and no RWMA and mild MV regurgitation and bicuspid fused aortic valve, and severe aortic dilation of 46 mm and a sending aortic dilation of 44 mm.  There is no aortic insufficiency noted on study.  She was referred to cardiac surgery and also had a CTA completed that verified aortic root dilation of 47 mm with borderline aneurysm of ascending thoracic aorta of 40 mm.  Study will be repeated in 6 months.  Since last being seen in the office patient reports***.  Patient denies chest pain, palpitations, dyspnea, PND, orthopnea, nausea, vomiting, dizziness, syncope, edema, weight gain, or early satiety.     ***Notes: Clearance for colonoscopy -Patient on ASA 81 mg Home Medications    Current Outpatient Medications  Medication Sig Dispense Refill   ALPRAZolam (XANAX) 0.25 MG tablet Take 1 tablet by mouth 3 (three) times daily as needed. Patient states she only uses as needed     aspirin 81 MG chewable tablet Chew 81 mg by mouth daily.      atorvastatin (LIPITOR) 10 MG tablet Take 10 mg by mouth daily.     Cholecalciferol (VITAMIN D3) 1.25 MG (50000 UT) CAPS Take  1 capsule by mouth daily.     fluticasone (FLONASE) 50 MCG/ACT nasal spray Place 1 spray into both nostrils daily.     Loratadine 10 MG CAPS Take 10 mg by mouth as needed.     metoprolol succinate (TOPROL XL) 25 MG 24 hr tablet Take 1 tablet (25 mg total) by mouth daily. 90 tablet 3   Multiple Vitamins-Minerals (CENTRUM SILVER 50+WOMEN PO) Take 1 tablet by mouth daily.     valsartan (DIOVAN) 80 MG tablet Take 80 mg by mouth daily.     No current facility-administered medications for this visit.     Review of Systems  Please see the history of present illness.    (+)*** (+)***  All other systems reviewed and are otherwise negative except as noted above.  Physical Exam    Wt Readings from Last 3 Encounters:  11/15/21 170 lb (77.1 kg)  07/16/21 173 lb (78.5 kg)  08/09/19 173 lb 8 oz (78.7 kg)   PJ:ASNKN were no vitals filed for this visit.,There is no height or weight on file to calculate BMI.  Constitutional:      Appearance: Healthy appearance. Not in distress.  Neck:     Vascular: JVD normal.  Pulmonary:     Effort: Pulmonary effort is normal.     Breath sounds: No wheezing. No rales. Diminished in the bases Cardiovascular:     Normal rate. Regular rhythm. Normal S1. Normal S2.      Murmurs: There is no murmur.  Edema:    Peripheral edema absent.  Abdominal:     Palpations: Abdomen is soft non tender. There is no hepatomegaly.  Skin:    General: Skin is warm and dry.  Neurological:     General: No focal deficit present.     Mental Status: Alert and oriented to person, place and time.     Cranial Nerves: Cranial nerves are intact.  EKG/LABS/Other Studies Reviewed    ECG personally reviewed by me today - ***  Risk Assessment/Calculations:   {Does this patient have ATRIAL FIBRILLATION?:[phone]}        Lab Results  Component Value Date   WBC 5.4 02/21/2018   HGB 14.2 02/21/2018   HCT 42.9 02/21/2018   MCV 87.5 02/21/2018   PLT 203 02/21/2018   Lab  Results  Component Value Date   CREATININE 0.77 11/04/2021   BUN 17 11/04/2021   NA 140 11/04/2021   K 4.4 11/04/2021   CL 103 11/04/2021   CO2 25 11/04/2021   Lab Results  Component Value Date   ALT 22 02/21/2018   AST 22 02/21/2018   ALKPHOS 72 02/21/2018   BILITOT 0.5 02/21/2018   No results found for: "CHOL", "HDL", "LDLCALC", "LDLDIRECT", "TRIG", "CHOLHDL"  No results found for: "HGBA1C"  Assessment & Plan    1.  Surgical clearance: -{Click Here to Calculate RCRI      :[id number]}  { Click Here to Calculate DASI      :517616073} {Select to add RCRI Risk (<1%=LOW; >/=1%=HIGH) (Optional):21036017}  {Select if HIGH (RCRI >/=1%) Risk (Optional):21036030} Recommendations: {2014 ACC/AHA Perioperative Guidelines  :21036001} Antiplatelet and/or Anticoagulation Recommendations: {Antiplatelet Recommendations                  :21036016} {Anticoagulation Recommendations           :71062694}    2.  Bicuspid AV/aortic root dilation: -2D echo was completed 2022 with plan to repeat next year -Cardiac CTA completed 5/2023that verified aortic root dilation of 47 mm with borderline aneurysm of ascending thoracic aorta of 40 mm.  Study will be repeated in 6 months. -Patient currently on GDMT with ASA 81 mg and atorvastatin 10 mg   3.  Essential hypertension -Blood pressure today was*** -Continue Diovan 80 mg and Toprol-XL 25 mg daily  4.  Hyperlipidemia: -Patient's last LDL was -Continue atorvastatin 10 mg daily      Disposition: Follow-up with Larae Grooms, MD or APP in *** months {Are you ordering a CV Procedure (e.g. stress test, cath, DCCV, TEE, etc)?   Press F2        :[id number]}   Medication Adjustments/Labs and Tests Ordered: Current medicines are reviewed at length with the patient today.  Concerns regarding medicines are outlined above.   Signed, Mable Fill, Marissa Nestle, NP 05/15/2022, 11:57 AM [facility]  Note:  This document was  prepared using Dragon voice recognition software and may include unintentional dictation errors.

## 2022-05-16 ENCOUNTER — Encounter: Payer: Self-pay | Admitting: Nurse Practitioner

## 2022-05-16 ENCOUNTER — Ambulatory Visit: Payer: BC Managed Care – PPO | Attending: Nurse Practitioner | Admitting: Nurse Practitioner

## 2022-05-16 VITALS — BP 130/90 | HR 54 | Ht 64.0 in | Wt 178.2 lb

## 2022-05-16 DIAGNOSIS — E785 Hyperlipidemia, unspecified: Secondary | ICD-10-CM

## 2022-05-16 DIAGNOSIS — Z01810 Encounter for preprocedural cardiovascular examination: Secondary | ICD-10-CM | POA: Diagnosis not present

## 2022-05-16 DIAGNOSIS — I77810 Thoracic aortic ectasia: Secondary | ICD-10-CM | POA: Diagnosis not present

## 2022-05-16 DIAGNOSIS — I10 Essential (primary) hypertension: Secondary | ICD-10-CM

## 2022-05-16 NOTE — Patient Instructions (Signed)
Medication Instructions:  Take Toprol daily in the evenings  Take Diovan daily in the mornings *If you need a refill on your cardiac medications before your next appointment, please call your pharmacy*   Lab Work: None Ordered   Testing/Procedures: None Ordered   Follow-Up: At [facility], you and your health needs are our priority.  As part of our continuing mission to provide you with exceptional heart care, we have created designated Provider Care Teams.  These Care Teams include your primary Cardiologist (physician) and Advanced Practice Providers (APPs -  Physician Assistants and Nurse Practitioners) who all work together to provide you with the care you need, when you need it.  We recommend signing up for the patient portal called "MyChart".  Sign up information is provided on this After Visit Summary.  MyChart is used to connect with patients for Virtual Visits (Telemedicine).  Patients are able to view lab/test results, encounter notes, upcoming appointments, etc.  Non-urgent messages can be sent to your provider as well.   To learn more about what you can do with MyChart, go to [email or web address].    Your next appointment:   Follow up as scheduled  The format for your next appointment:   In Person  Provider:   Larae Grooms, MD     Other Instructions   Important Information About Sugar

## 2022-05-17 DIAGNOSIS — Z01419 Encounter for gynecological examination (general) (routine) without abnormal findings: Secondary | ICD-10-CM | POA: Diagnosis not present

## 2022-05-17 DIAGNOSIS — Z6830 Body mass index (BMI) 30.0-30.9, adult: Secondary | ICD-10-CM | POA: Diagnosis not present

## 2022-05-18 ENCOUNTER — Ambulatory Visit
Admission: RE | Admit: 2022-05-18 | Discharge: 2022-05-18 | Disposition: A | Discharge status: Home / Self Care | Payer: BC Managed Care – PPO | Source: Ambulatory Visit | Attending: Internal Medicine | Admitting: Internal Medicine

## 2022-05-18 DIAGNOSIS — Z1231 Encounter for screening mammogram for malignant neoplasm of breast: Secondary | ICD-10-CM | POA: Diagnosis not present

## 2022-05-30 ENCOUNTER — Encounter: Payer: Self-pay | Admitting: Interventional Cardiology

## 2022-06-01 ENCOUNTER — Ambulatory Visit: Payer: BC Managed Care – PPO | Admitting: Surgery

## 2022-06-01 ENCOUNTER — Ambulatory Visit
Admission: RE | Admit: 2022-06-01 | Discharge: 2022-06-01 | Disposition: A | Discharge status: Home / Self Care | Payer: BC Managed Care – PPO | Source: Ambulatory Visit | Attending: Surgery | Admitting: Surgery

## 2022-06-01 ENCOUNTER — Encounter: Payer: Self-pay | Admitting: Surgery

## 2022-06-01 VITALS — BP 144/89 | HR 86 | Resp 20 | Ht 64.0 in | Wt 175.0 lb

## 2022-06-01 DIAGNOSIS — Z853 Personal history of malignant neoplasm of breast: Secondary | ICD-10-CM | POA: Diagnosis not present

## 2022-06-01 DIAGNOSIS — J984 Other disorders of lung: Secondary | ICD-10-CM | POA: Diagnosis not present

## 2022-06-01 DIAGNOSIS — I7121 Aneurysm of the ascending aorta, without rupture: Secondary | ICD-10-CM

## 2022-06-01 DIAGNOSIS — K649 Unspecified hemorrhoids: Secondary | ICD-10-CM | POA: Insufficient documentation

## 2022-06-01 DIAGNOSIS — N3090 Cystitis, unspecified without hematuria: Secondary | ICD-10-CM | POA: Insufficient documentation

## 2022-06-01 DIAGNOSIS — I712 Thoracic aortic aneurysm, without rupture, unspecified: Secondary | ICD-10-CM | POA: Diagnosis not present

## 2022-06-01 DIAGNOSIS — I700 Atherosclerosis of aorta: Secondary | ICD-10-CM | POA: Diagnosis not present

## 2022-06-01 DIAGNOSIS — N819 Female genital prolapse, unspecified: Secondary | ICD-10-CM | POA: Insufficient documentation

## 2022-06-01 DIAGNOSIS — N950 Postmenopausal bleeding: Secondary | ICD-10-CM | POA: Insufficient documentation

## 2022-06-01 MED ORDER — IOPAMIDOL (ISOVUE-370) INJECTION 76%
75.0000 mL | Freq: Once | INTRAVENOUS | Status: AC | PRN
  Administered 2022-06-01: 75 mL via INTRAVENOUS

## 2022-06-01 NOTE — Progress Notes (Signed)
HPI:  The patient is a 66 year old woman with a history of hypertension, hyperlipidemia, carotid stenosis with a dissection of the internal carotid artery, and possible bicuspid aortic valve with an aortic root and ascending aortic aneurysm found on a screening CT.  She had a brother who died suddenly at age 21 of unexplained cause.  She has another brother who has an aortic aneurysm and bicuspid aortic valve.  Her last echocardiogram on 05/19/2021 was felt to show a likely bicuspid valve with fusion of the right and left cusp.  There is mild calcification and trivial regurgitation.  The mean gradient was 4 mmHg.  The aortic root was measured at 4.6 cm with an ascending aorta of 4.4 cm.  Previous CTA of the chest on 11/11/2021 showed an aortic root measurement of 4.7 cm and an ascending aortic diameter of 4.0 cm.  She continues to feel well without chest pain or shortness of breath.  She does check her blood pressure at home and it is usually in the 120-130 range.  Current Outpatient Medications  Medication Sig Dispense Refill   ALPRAZolam (XANAX) 0.25 MG tablet Take 1 tablet by mouth 3 (three) times daily as needed. Patient states she only uses as needed     aspirin 81 MG chewable tablet Chew 81 mg by mouth daily.      atorvastatin (LIPITOR) 10 MG tablet Take 10 mg by mouth daily.     Cholecalciferol (VITAMIN D3) 1.25 MG (50000 UT) CAPS Take 1,000 Units by mouth daily.     fluticasone (FLONASE) 50 MCG/ACT nasal spray Place 1 spray into both nostrils daily.     Loratadine 10 MG CAPS Take 10 mg by mouth as needed.     metoprolol succinate (TOPROL XL) 25 MG 24 hr tablet Take 1 tablet (25 mg total) by mouth daily. 90 tablet 3   Multiple Vitamins-Minerals (CENTRUM SILVER 50+WOMEN PO) Take 1 tablet by mouth daily.     valsartan (DIOVAN) 80 MG tablet Take 80 mg by mouth daily.     No current facility-administered medications for this visit.     Physical Exam: BP (!) 144/89   Pulse 86   Resp 20    Ht '5\' 4"'$  (1.626 m)   Wt 175 lb (79.4 kg)   SpO2 97% Comment: RA  BMI 30.04 kg/m  She looks well. Cardiac exam shows regular rate and rhythm with normal heart sounds.  There is no murmur. Lungs are clear.  Diagnostic Tests:  Narrative & Impression  CLINICAL DATA:  Aortic aneurysm suspected.  Breast cancer.   EXAM: CT ANGIOGRAPHY CHEST WITH CONTRAST   TECHNIQUE: Multidetector CT imaging of the chest was performed using the standard protocol during bolus administration of intravenous contrast. Multiplanar CT image reconstructions and MIPs were obtained to evaluate the vascular anatomy.   RADIATION DOSE REDUCTION: This exam was performed according to the departmental dose-optimization program which includes automated exposure control, adjustment of the mA and/or kV according to patient size and/or use of iterative reconstruction technique.   CONTRAST:  31m ISOVUE-370 IOPAMIDOL (ISOVUE-370) INJECTION 76%   COMPARISON:  CT abdomen pelvis 03/10/2022 and CT chest 11/11/2021.   FINDINGS: Cardiovascular: Ascending aorta measures up to 4.3 cm (7/57), as on 11/11/2021. Aortic root measures approximately 4.4 cm. There is motion artifact. Minimal atherosclerotic calcification of the aorta. Heart is enlarged. No pericardial effusion.   Mediastinum/Nodes: No pathologically enlarged mediastinal, hilar or axillary lymph nodes. Surgical clips in the left breast. Esophagus is grossly unremarkable.  Lungs/Pleura: Minimal scattered linear scarring in the lungs. No suspicious pulmonary nodules. No pleural fluid. Airway is unremarkable.   Upper Abdomen: Visualized portions of the liver and gallbladder are unremarkable. 1.7 cm nodule in the right adrenal gland measures 18 Hounsfield units. No specific follow-up necessary. Visualized portions of the adrenal glands, kidneys, spleen, pancreas, stomach and bowel are otherwise unremarkable with exception of a small hiatal hernia. No upper  abdominal adenopathy.   Musculoskeletal: Degenerative changes in the spine. No worrisome lytic or sclerotic lesions.   Review of the MIP images confirms the above findings.   IMPRESSION: 1. 4.3 cm ascending aortic aneurysm. Recommend annual imaging followup by CTA or MRA. This recommendation follows 2010 ACCF/AHA/AATS/ACR/ASA/SCA/SCAI/SIR/STS/SVM Guidelines for the Diagnosis and Management of Patients with Thoracic Aortic Disease. Circulation. 2010; 121: C003-K917. Aortic aneurysm NOS (ICD10-I71.9). 2. Right adrenal adenoma. 3.  Aortic atherosclerosis (ICD10-I70.0).     Electronically Signed   By: Lorin Picket M.D.   On: 06/01/2022 13:53      Impression:  This 66 year old woman has a possible bicuspid aortic valve with an aortic root and ascending aortic aneurysm.  CTA of the chest today shows the aortic root diameter to be 4.4 cm with an ascending aortic diameter 4.3 cm.  This is about the same as her previous CTA of the chest in May 2023.  In comparison her echocardiogram 1 year ago showed the aortic root diameter to be 4.7 cm and the ascending aortic diameter to be 4.5 cm.  I have personally reviewed her 2D echocardiogram and there are 3, measures but may be fusion of the right and left coronary leaflets.  There was no significant aortic stenosis or insufficiency.  I reviewed all the studies with her and her husband and answered their questions.  Her aortic aneurysm is still below the surgical threshold of 5.0 cm for patients with a bicuspid aortic valve.  I discussed the importance of good blood pressure control in preventing further enlargement and acute aortic dissection.  I also advised her against doing any heavy lifting that may require a Valsalva maneuver and could suddenly raise her blood pressure to high levels.  I also cautioned her against taking fluoroquinolone antibiotics.  I have recommended a follow-up CTA of the chest in 1 year.  Plan:  She will return to see me in  1 year with a CTA of the chest.  She will continue to follow-up with Dr. Irish Lack and is scheduled for a repeat echo in January 2024.  I spent 20 minutes performing this established patient evaluation and > 50% of this time was spent face to face counseling and coordinating the care of this patient's bicuspid aortic valve and aortic aneurysm.    Gaye Pollack, MD [facility] [phone]

## 2022-06-04 DIAGNOSIS — J101 Influenza due to other identified influenza virus with other respiratory manifestations: Secondary | ICD-10-CM | POA: Diagnosis not present

## 2022-06-15 DIAGNOSIS — M545 Low back pain, unspecified: Secondary | ICD-10-CM | POA: Diagnosis not present

## 2022-06-15 DIAGNOSIS — M79652 Pain in left thigh: Secondary | ICD-10-CM | POA: Diagnosis not present

## 2022-06-15 DIAGNOSIS — G8929 Other chronic pain: Secondary | ICD-10-CM | POA: Diagnosis not present

## 2022-06-23 ENCOUNTER — Other Ambulatory Visit: Payer: Self-pay | Admitting: Interventional Cardiology

## 2022-07-11 ENCOUNTER — Ambulatory Visit (HOSPITAL_COMMUNITY): Payer: BC Managed Care – PPO | Attending: Interventional Cardiology

## 2022-07-11 DIAGNOSIS — Q231 Congenital insufficiency of aortic valve: Secondary | ICD-10-CM | POA: Diagnosis not present

## 2022-07-11 DIAGNOSIS — I719 Aortic aneurysm of unspecified site, without rupture: Secondary | ICD-10-CM | POA: Diagnosis not present

## 2022-07-11 LAB — ECHOCARDIOGRAM COMPLETE
Area-P 1/2: 3.72 cm2
S' Lateral: 2.5 cm

## 2022-07-13 ENCOUNTER — Telehealth: Payer: Self-pay

## 2022-07-13 NOTE — Telephone Encounter (Signed)
-----   Message from Jettie Booze, MD sent at 07/12/2022  2:41 PM EST ----- Normal LV/RV function.   No significant change from prior study. Ascending aorta measured 44 mm 05/2021, now 45 mm, within expected variability.  Bicuspid aortic valve with moderate ascending aorta dilation of 45 mm.  Repeat echo in 1 year.  Also should be getting annual CTA of chest to eval aorta.  Last test was in 11/23.

## 2022-07-13 NOTE — Telephone Encounter (Signed)
Results reviewed with patient who verbalizes understanding that she needs repeat ECHO and Cta in 1 year.

## 2022-07-14 NOTE — Progress Notes (Addendum)
Cardiology Office Note   Date:  07/15/2022   ID:  [name], [name] Apr 14, 1956, MRN [id number]  PCP:  Lavone Orn, MD    No chief complaint on file.  HTN  Wt Readings from Last 3 Encounters:  07/15/22 174 lb 6.4 oz (79.1 kg)  06/01/22 175 lb (79.4 kg)  05/16/22 178 lb 3.2 oz (80.8 kg)       History of Present Illness: [name] is a 67 y.o. female with history of of ascending aortic dilatation/bicuspid aortic valve.  2022 echo showed: "Left ventricular ejection fraction, by estimation, is 55 to 60%. The  left ventricle has normal function. The left ventricle has no regional  wall motion abnormalities. Left ventricular diastolic parameters were  normal. The average left ventricular  global longitudinal strain is -13.8 %. The global longitudinal strain is  abnormal.   2. Right ventricular systolic function is normal. The right ventricular  size is normal.   3. The mitral valve is abnormal. Mild mitral valve regurgitation. No  evidence of mitral stenosis.   4. Likley bicuspid with fused right and left cusps . The aortic valve is bicuspid. There is mild calcification of the aortic valve. Aortic valve regurgitation is trivial. No aortic stenosis is present.   5. Aortic dilatation noted. There is severe dilatation of the aortic  root, measuring 46 mm. There is moderate dilatation of the ascending  aorta, measuring 44 mm.   6. The inferior vena cava is normal in size with greater than 50%  respiratory variability, suggesting right atrial pressure of 3 mmHg.   Comparison(s): EF 55%, mild LVH, AOR 89m, no AI seen."   As of January 2023: "Walks for exercise.  Has some DOE.  1-1.5 miles /day.  HR to 127.     Home readings BP 120s/80s.     Resting HR is in the 60's.     Brother with aortic aneurysm.  Maternal aunt had surgery for aneurysm.  Brother died at 54"  Home BP readings tend to be in the 1426-834Hrange systolic.    Started some strength training  with a trainer.   Denies : Chest pain. Dizziness. Leg edema. Nitroglycerin use. Orthopnea. Palpitations. Paroxysmal nocturnal dyspnea. Shortness of breath. Syncope.    Past Medical History:  Diagnosis Date   Anemia    Anxiety    Bicuspid aortic valve    [place]   left breast   Carotid artery stenosis    hx-dr dohmier monitors   COVID    Dilated aortic root (HCC)    DJD (degenerative joint disease) of knee    Dry eyes    Fibromuscular dysplasia (HCC)    GERD (gastroesophageal reflux disease)    Herpes simplex    History of breast cancer    HTN (hypertension)    IBS (irritable bowel syndrome)    Internal carotid artery dissection (HCC)    Low back strain    Migraine    Non-recurrent acute suppurative otitis media of both ears without spontaneous rupture of tympanic membranes    OSA (obstructive sleep apnea)    Osteoarthritis    RIGHT HAND   Osteoarthritis of left ankle    Osteoarthritis of right knee    Otitis media    Personal history of radiation therapy 2014   PONV (postoperative nausea and vomiting)    Pure hypercholesterolemia    Radiation 02/18/13-04/04/13   left breast   Seasonal allergic rhinitis  Situational stress    Tinnitus    Wears glasses     Past Surgical History:  Procedure Laterality Date   BACK SURGERY     BREAST LUMPECTOMY Left 12/31/2012   BREAST LUMPECTOMY WITH NEEDLE LOCALIZATION AND AXILLARY SENTINEL LYMPH NODE BX Left 12/31/2012   Procedure: LEFT BREAST LUMPECTOMY WITH NEEDLE LOCALIZATION AND LEFT AXILLARY SENTINEL LYMPH NODE BIOPSY;  Surgeon: Shann Medal, MD;  Location: [facility];  Service: General;  Laterality: Left;   COLONOSCOPY  2007   MASS EXCISION Right 08/09/2019   Procedure: EXCISION MUCOID TUMOR, DEBRIDEMENT DISTAL INTERPHALANGEAL RIGHT INDEX FINGER;  Surgeon: Daryll Brod, MD;  Location: [facility];  Service: Orthopedics;  Laterality: Right;  IV REGIONAL FOREARM BIER BLOCK    NASAL POLYP SURGERY     WISDOM TOOTH EXTRACTION       Current Outpatient Medications  Medication Sig Dispense Refill   ALPRAZolam (XANAX) 0.25 MG tablet Take 1 tablet by mouth 3 (three) times daily as needed. Patient states she only uses as needed     aspirin 81 MG chewable tablet Chew 81 mg by mouth daily.      atorvastatin (LIPITOR) 10 MG tablet Take 10 mg by mouth daily.     Cholecalciferol (VITAMIN D3) 1.25 MG (50000 UT) CAPS Take 1,000 Units by mouth daily.     fluticasone (FLONASE) 50 MCG/ACT nasal spray Place 1 spray into both nostrils daily.     GENTLE LAXATIVE 5 MG EC tablet Take by mouth as directed.     Loratadine 10 MG CAPS Take 10 mg by mouth as needed.     metoprolol succinate (TOPROL-XL) 25 MG 24 hr tablet TAKE 1 TABLET DAILY 90 tablet 3   Multiple Vitamins-Minerals (CENTRUM SILVER 50+WOMEN PO) Take 1 tablet by mouth daily.     polyethylene glycol-electrolytes (NULYTELY) 420 g solution MIX AND DRINK AS DIRECTED     valsartan (DIOVAN) 80 MG tablet Take 80 mg by mouth daily.     No current facility-administered medications for this visit.    Allergies:   Patient has no known allergies.    Social History:  The patient  reports that she has never smoked. She has never used smokeless tobacco. She reports current alcohol use. She reports that she does not use drugs.   Family History:  The patient's family history includes Brain cancer in her paternal grandfather; Breast cancer (age of onset: 25) in her cousin and cousin; Melanoma in her daughter; Uterine cancer in her mother.    ROS:  Please see the history of present illness.   Otherwise, review of systems are positive for increased bloating.   All other systems are reviewed and negative.    PHYSICAL EXAM: VS:  BP (!) 138/98   Pulse 64   Ht '5\' 4"'$  (1.626 m)   Wt 174 lb 6.4 oz (79.1 kg)   SpO2 98%   BMI 29.94 kg/m  , BMI Body mass index is 29.94 kg/m. GEN: Well nourished, well developed, in no acute distress HEENT:  normal Neck: no JVD, carotid bruits, or masses Cardiac: RRR; 1/6 early systolic murmurs, rubs, or gallops,no edema  Respiratory:  clear to auscultation bilaterally, normal work of breathing GI: soft, nontender, nondistended, + BS MS: no deformity or atrophy Skin: warm and dry, no rash Neuro:  Strength and sensation are intact Psych: euthymic mood, full affect   EKG:   The ekg ordered today demonstrates sinus brady, NSST in 11/23   Recent Labs:  11/04/2021: BUN 17; Creatinine, Ser 0.77; Potassium 4.4; Sodium 140   Lipid Panel No results found for: "CHOL", "TRIG", "HDL", "CHOLHDL", "VLDL", "LDLCALC", "LDLDIRECT"   Other studies Reviewed: Additional studies/ records that were reviewed today with results demonstrating: labs reviewed.  October 2023 total cholesterol 195 HDL 64 LDL 98 triglycerides 188.   ASSESSMENT AND PLAN:  Bicuspid aortic valve/thoracic aortic aneurysm: Blood pressure control is very important. Well controlled at home.  No systolic readings below 355.  CTA of the chest in November 2024.  Echocardiogram in May 2025. Would avoid fluoroquinolones. Follows with Dr. Cyndia Bent as well. Avoid straining.   Hypertension: Home blood pressure readings are well-controlled on the current medications.  Limiting salt in the diet.  Avoiding processed foods.  Blood pressure is often higher at doctor's office readings for her.  Would not change any medications given the home blood pressure reading list she brought in. Hyperlipidemia: LDL less than 100 at this time.  Continue statin.  She is increasing exercise.  This should also help with lipid-lowering.  Labs checked with her primary care doctor. Aortic atherosclerosis: Healthy lifestyle with whole food plant-based diet and regular exercise.  Continue atorvastatin as well.   Current medicines are reviewed at length with the patient today.  The patient concerns regarding her medicines were addressed.  The following changes have been made:   No change  Labs/ tests ordered today include:  No orders of the defined types were placed in this encounter.   Recommend 150 minutes/week of aerobic exercise Low fat, low carb, high fiber diet recommended  Disposition:   FU in 1 year   Signed, Larae Grooms, MD  07/15/2022 8:42 AM    [facility] [place], [place], [place]  [address] Phone: [phone]; Fax: [phone]

## 2022-07-15 ENCOUNTER — Ambulatory Visit: Payer: BC Managed Care – PPO | Attending: Interventional Cardiology | Admitting: Interventional Cardiology

## 2022-07-15 ENCOUNTER — Encounter: Payer: Self-pay | Admitting: Interventional Cardiology

## 2022-07-15 VITALS — BP 138/98 | HR 64 | Ht 64.0 in | Wt 174.4 lb

## 2022-07-15 DIAGNOSIS — E782 Mixed hyperlipidemia: Secondary | ICD-10-CM | POA: Diagnosis not present

## 2022-07-15 DIAGNOSIS — Q231 Congenital insufficiency of aortic valve: Secondary | ICD-10-CM | POA: Diagnosis not present

## 2022-07-15 DIAGNOSIS — I700 Atherosclerosis of aorta: Secondary | ICD-10-CM

## 2022-07-15 DIAGNOSIS — I10 Essential (primary) hypertension: Secondary | ICD-10-CM | POA: Diagnosis not present

## 2022-07-15 DIAGNOSIS — I719 Aortic aneurysm of unspecified site, without rupture: Secondary | ICD-10-CM

## 2022-07-15 NOTE — Patient Instructions (Signed)
Medication Instructions:  Your physician recommends that you continue on your current medications as directed. Please refer to the Current Medication list given to you today.  *If you need a refill on your cardiac medications before your next appointment, please call your pharmacy*   Lab Work: none If you have labs (blood work) drawn today and your tests are completely normal, you will receive your results only by: [place] (if you have MyChart) OR A paper copy in the mail If you have any lab test that is abnormal or we need to change your treatment, we will call you to review the results.   Testing/Procedures: Your physician has requested that you have an echocardiogram. Echocardiography is a painless test that uses sound waves to create images of your heart. It provides your doctor with information about the size and shape of your heart and how well your heart's chambers and valves are working. This procedure takes approximately one hour. There are no restrictions for this procedure. Please do NOT wear cologne, perfume, aftershave, or lotions (deodorant is allowed). Please arrive 15 minutes prior to your appointment time. Will plan to do in May 2025   Follow-Up: At [facility], you and your health needs are our priority.  As part of our continuing mission to provide you with exceptional heart care, we have created designated Provider Care Teams.  These Care Teams include your primary Cardiologist (physician) and Advanced Practice Providers (APPs -  Physician Assistants and Nurse Practitioners) who all work together to provide you with the care you need, when you need it.  We recommend signing up for the patient portal called "MyChart".  Sign up information is provided on this After Visit Summary.  MyChart is used to connect with patients for Virtual Visits (Telemedicine).  Patients are able to view lab/test results, encounter notes, upcoming appointments, etc.  Non-urgent  messages can be sent to your provider as well.   To learn more about what you can do with MyChart, go to [email or web address].    Your next appointment:   12 month(s)  Provider:   Larae Grooms, MD     Other Instructions

## 2022-07-21 DIAGNOSIS — K6389 Other specified diseases of intestine: Secondary | ICD-10-CM | POA: Diagnosis not present

## 2022-07-21 DIAGNOSIS — D125 Benign neoplasm of sigmoid colon: Secondary | ICD-10-CM | POA: Diagnosis not present

## 2022-07-21 DIAGNOSIS — K5730 Diverticulosis of large intestine without perforation or abscess without bleeding: Secondary | ICD-10-CM | POA: Diagnosis not present

## 2022-07-21 DIAGNOSIS — K648 Other hemorrhoids: Secondary | ICD-10-CM | POA: Diagnosis not present

## 2022-07-21 DIAGNOSIS — R933 Abnormal findings on diagnostic imaging of other parts of digestive tract: Secondary | ICD-10-CM | POA: Diagnosis not present

## 2022-07-21 DIAGNOSIS — K5140 Inflammatory polyps of colon without complications: Secondary | ICD-10-CM | POA: Diagnosis not present

## 2022-07-21 DIAGNOSIS — K5732 Diverticulitis of large intestine without perforation or abscess without bleeding: Secondary | ICD-10-CM | POA: Diagnosis not present

## 2022-08-01 ENCOUNTER — Other Ambulatory Visit: Payer: Self-pay | Admitting: Internal Medicine

## 2022-08-01 ENCOUNTER — Ambulatory Visit
Admission: RE | Admit: 2022-08-01 | Discharge: 2022-08-01 | Disposition: A | Discharge status: Home / Self Care | Payer: BC Managed Care – PPO | Source: Ambulatory Visit | Attending: Internal Medicine | Admitting: Internal Medicine

## 2022-08-01 ENCOUNTER — Ambulatory Visit (HOSPITAL_BASED_OUTPATIENT_CLINIC_OR_DEPARTMENT_OTHER)
Admission: RE | Admit: 2022-08-01 | Discharge: 2022-08-01 | Disposition: A | Discharge status: Home / Self Care | Payer: BC Managed Care – PPO | Source: Ambulatory Visit | Attending: Internal Medicine | Admitting: Internal Medicine

## 2022-08-01 ENCOUNTER — Other Ambulatory Visit (HOSPITAL_COMMUNITY): Payer: Self-pay | Admitting: Internal Medicine

## 2022-08-01 DIAGNOSIS — M7989 Other specified soft tissue disorders: Secondary | ICD-10-CM | POA: Insufficient documentation

## 2022-11-09 DIAGNOSIS — I10 Essential (primary) hypertension: Secondary | ICD-10-CM | POA: Diagnosis not present

## 2022-11-15 DIAGNOSIS — L820 Inflamed seborrheic keratosis: Secondary | ICD-10-CM | POA: Diagnosis not present

## 2023-02-10 DIAGNOSIS — D485 Neoplasm of uncertain behavior of skin: Secondary | ICD-10-CM | POA: Diagnosis not present

## 2023-03-23 DIAGNOSIS — B078 Other viral warts: Secondary | ICD-10-CM | POA: Diagnosis not present

## 2023-04-10 ENCOUNTER — Other Ambulatory Visit: Payer: Self-pay | Admitting: Internal Medicine

## 2023-04-10 DIAGNOSIS — Z1231 Encounter for screening mammogram for malignant neoplasm of breast: Secondary | ICD-10-CM

## 2023-04-12 ENCOUNTER — Other Ambulatory Visit: Payer: Self-pay | Admitting: Surgery

## 2023-04-12 DIAGNOSIS — I7121 Aneurysm of the ascending aorta, without rupture: Secondary | ICD-10-CM

## 2023-05-03 ENCOUNTER — Encounter: Payer: Self-pay | Admitting: Surgery

## 2023-05-10 DIAGNOSIS — B078 Other viral warts: Secondary | ICD-10-CM | POA: Diagnosis not present

## 2023-05-17 DIAGNOSIS — H25813 Combined forms of age-related cataract, bilateral: Secondary | ICD-10-CM | POA: Diagnosis not present

## 2023-05-22 ENCOUNTER — Ambulatory Visit: Payer: BC Managed Care – PPO

## 2023-05-23 DIAGNOSIS — Z01419 Encounter for gynecological examination (general) (routine) without abnormal findings: Secondary | ICD-10-CM | POA: Diagnosis not present

## 2023-05-31 ENCOUNTER — Inpatient Hospital Stay
Admission: RE | Admit: 2023-05-31 | Discharge: 2023-05-31 | Disposition: A | Discharge status: Home / Self Care | Payer: BC Managed Care – PPO | Source: Ambulatory Visit | Attending: Surgery | Admitting: Surgery

## 2023-05-31 DIAGNOSIS — I7121 Aneurysm of the ascending aorta, without rupture: Secondary | ICD-10-CM | POA: Diagnosis not present

## 2023-05-31 MED ORDER — IOPAMIDOL (ISOVUE-370) INJECTION 76%
100.0000 mL | Freq: Once | INTRAVENOUS | Status: AC | PRN
  Administered 2023-05-31: 75 mL via INTRAVENOUS

## 2023-06-05 DIAGNOSIS — E7800 Pure hypercholesterolemia, unspecified: Secondary | ICD-10-CM | POA: Diagnosis not present

## 2023-06-05 DIAGNOSIS — Z0000 Encounter for general adult medical examination without abnormal findings: Secondary | ICD-10-CM | POA: Diagnosis not present

## 2023-06-05 DIAGNOSIS — I77810 Thoracic aortic ectasia: Secondary | ICD-10-CM | POA: Diagnosis not present

## 2023-06-05 DIAGNOSIS — Z79899 Other long term (current) drug therapy: Secondary | ICD-10-CM | POA: Diagnosis not present

## 2023-06-05 DIAGNOSIS — I10 Essential (primary) hypertension: Secondary | ICD-10-CM | POA: Diagnosis not present

## 2023-06-05 DIAGNOSIS — M8580 Other specified disorders of bone density and structure, unspecified site: Secondary | ICD-10-CM | POA: Diagnosis not present

## 2023-06-07 ENCOUNTER — Ambulatory Visit: Payer: BC Managed Care – PPO | Admitting: Surgery

## 2023-06-07 ENCOUNTER — Encounter: Payer: Self-pay | Admitting: Surgery

## 2023-06-07 VITALS — BP 144/85 | HR 82 | Resp 18 | Ht 64.0 in | Wt 165.0 lb

## 2023-06-07 DIAGNOSIS — I7121 Aneurysm of the ascending aorta, without rupture: Secondary | ICD-10-CM

## 2023-06-07 NOTE — Progress Notes (Signed)
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 She had a brother who died suddenly at age 63 of unexplained cause. She has another brother who has an aortic aneurysm and bicuspid aortic valve.   Her last CTA 1 year ago showed the aortic root to be 4.4 cm and the ascending aorta to be 4.3 cm. Echo in Jan 2024 showed a bicuspid aortic valve with mild calcification and trivial AI. The ascending aorta was measured at 4.5 cm.  She continues to feel well without chest pain or shortness of breath.  Current Outpatient Medications  Medication Sig Dispense Refill   ALPRAZolam (XANAX) 0.25 MG tablet Take 1 tablet by mouth 3 (three) times daily as needed. Patient states she only uses as needed     aspirin 81 MG chewable tablet Chew 81 mg by mouth daily.      atorvastatin (LIPITOR) 10 MG tablet Take 10 mg by mouth daily.     Cholecalciferol (VITAMIN D3) 1.25 MG (50000 UT) CAPS Take 1,000 Units by mouth daily.     fluticasone (FLONASE) 50 MCG/ACT nasal spray Place 1 spray into both nostrils daily.     Loratadine 10 MG CAPS Take 10 mg by mouth as needed.     metoprolol succinate (TOPROL-XL) 25 MG 24 hr tablet TAKE 1 TABLET DAILY 90 tablet 3   Multiple Vitamins-Minerals (CENTRUM SILVER 50+WOMEN PO) Take 1 tablet by mouth daily.     valsartan (DIOVAN) 80 MG tablet Take 80 mg by mouth daily.     No current facility-administered medications for this visit.     Physical Exam: BP (!) 144/85   Pulse 82   Resp 18   Ht 5\' 4"  (1.626 m)   Wt 165 lb (74.8 kg)   SpO2 98% Comment: RA  BMI 28.32 kg/m  She looks well. Cardiac exam shows regular rate and rhythm with normal heart sounds.  There is no murmur. Lungs are clear.  Diagnostic Tests:  Narrative & Impression  CLINICAL DATA:   Thoracic aortic aneurysm.   EXAM: CT ANGIOGRAPHY CHEST WITH CONTRAST   TECHNIQUE: Multidetector CT imaging of the chest was performed using the standard protocol during bolus administration of intravenous contrast. Multiplanar CT image reconstructions and MIPs were obtained to evaluate the vascular anatomy.   RADIATION DOSE REDUCTION: This exam was performed according to the departmental dose-optimization program which includes automated exposure control, adjustment of the mA and/or kV according to patient size and/or use of iterative reconstruction technique.   CONTRAST:  75mL ISOVUE-370 IOPAMIDOL (ISOVUE-370) INJECTION 76%   COMPARISON:  June 01, 2022.   FINDINGS: Cardiovascular: Grossly stable 4.3 cm ascending thoracic aortic aneurysm. No dissection is noted. Great vessels are widely patent without significant stenosis. Normal cardiac size. No pericardial effusion.   Mediastinum/Nodes: No enlarged mediastinal, hilar, or axillary lymph nodes. Thyroid gland, trachea, and esophagus demonstrate no significant findings.   Lungs/Pleura: No pneumothorax or pleural effusion is noted. Minimal bibasilar scarring is noted. No acute pulmonary abnormality is noted.   Upper Abdomen: No acute abnormality.   Musculoskeletal: No chest wall abnormality. No acute or significant osseous findings.   Review of the MIP images confirms the above findings.   IMPRESSION: Grossly stable 4.3 cm ascending thoracic aortic aneurysm. Recommend annual imaging followup by CTA or MRA. This  recommendation follows 2010 ACCF/AHA/AATS/ACR/ASA/SCA/SCAI/SIR/STS/SVM Guidelines for the Diagnosis and Management of Patients with Thoracic Aortic Disease. Circulation. 2010; 121: W119-J478. Aortic aneurysm NOS (ICD10-I71.9).     Electronically Signed   By: Lupita Raider M.D.   On: 05/31/2023 10:01      Impression:  I have personally reviewed her current CTA images and compared them to her CTA  images from last year. Her current CTA shows a diameter at the sinus level of 4.6 cm compared to 4.4 cm last year. The ascending aortic diameter is 4.3 cm. Her aortic aneurysm is still below the surgical threshold of 5.0 cm for patients with a bicuspid aortic valve.  I discussed the importance of good blood pressure control in preventing further enlargement and acute aortic dissection.  I also advised her against doing any heavy lifting that may require a Valsalva maneuver and could suddenly raise her blood pressure to high levels.  I also cautioned her against taking fluoroquinolone antibiotics.  I have recommended a follow-up CTA of the chest in 1 year.   Plan:   She will return to see me in 1 year with a CTA of the chest.     I spent 15 minutes performing this established patient evaluation and > 50% of this time was spent face to face counseling and coordinating the care of this patient's bicuspid aortic valve and aortic aneurysm.      Alleen Borne, MD [facility] [phone]

## 2023-06-12 ENCOUNTER — Ambulatory Visit: Payer: BC Managed Care – PPO

## 2023-06-19 ENCOUNTER — Other Ambulatory Visit: Payer: Self-pay | Admitting: Interventional Cardiology

## 2023-06-22 DIAGNOSIS — B351 Tinea unguium: Secondary | ICD-10-CM | POA: Diagnosis not present

## 2023-06-22 DIAGNOSIS — B078 Other viral warts: Secondary | ICD-10-CM | POA: Diagnosis not present

## 2023-07-06 ENCOUNTER — Ambulatory Visit
Admission: RE | Admit: 2023-07-06 | Discharge: 2023-07-06 | Disposition: A | Discharge status: Home / Self Care | Payer: BC Managed Care – PPO | Source: Ambulatory Visit | Attending: Internal Medicine | Admitting: Internal Medicine

## 2023-07-06 DIAGNOSIS — Z1231 Encounter for screening mammogram for malignant neoplasm of breast: Secondary | ICD-10-CM

## 2023-07-13 DIAGNOSIS — J0110 Acute frontal sinusitis, unspecified: Secondary | ICD-10-CM | POA: Diagnosis not present

## 2023-07-28 NOTE — Progress Notes (Unsigned)
Cardiology Office Note   Date:  07/31/2023   ID:  [name], [name] 06-26-1956, MRN [id number]  PCP:  Thana Ates, MD  Cardiologist:   Dietrich Pates, MD   Pt presents for follow up of AV dz and dilated ascending aorta     History of Present Illness: [name] is a 68 y.o. female with a history of dilated ascending aortic and bicuspid aortic valve.   She was last seen in cariology by Catalina Gravel in Jan 2024  She also follows with B Bartle  last seen at the end of 2024 CTA of chest aorta measured 43 mm    Echo in Jan 2024 showed functionallu bicuspid AV   No AS  No significant AI  Since seen the pt has done well  Denies CP  No SOB  No dizziness    She says BP at home is good           Current Meds  Medication Sig   ALPRAZolam (XANAX) 0.25 MG tablet Take 1 tablet by mouth 3 (three) times daily as needed. Patient states she only uses as needed   aspirin 81 MG chewable tablet Chew 81 mg by mouth daily.    atorvastatin (LIPITOR) 10 MG tablet Take 10 mg by mouth daily.   Cholecalciferol (VITAMIN D3) 1.25 MG (50000 UT) CAPS Take 1,000 Units by mouth daily.   fluticasone (FLONASE) 50 MCG/ACT nasal spray Place 1 spray into both nostrils daily.   Loratadine 10 MG CAPS Take 10 mg by mouth as needed.   metoprolol succinate (TOPROL-XL) 25 MG 24 hr tablet TAKE 1 TABLET DAILY   Multiple Vitamins-Minerals (CENTRUM SILVER 50+WOMEN PO) Take 1 tablet by mouth daily.   valsartan (DIOVAN) 80 MG tablet Take 80 mg by mouth daily.     Allergies:   Patient has no known allergies.   Past Medical History:  Diagnosis Date   Anemia    Anxiety    Bicuspid aortic valve    Breast cancer (HCC) 12/12/12 bx   left breast   Carotid artery stenosis    hx-dr dohmier monitors   COVID    Dilated aortic root (HCC)    DJD (degenerative joint disease) of knee    Dry eyes    Fibromuscular dysplasia (HCC)    GERD (gastroesophageal reflux disease)    Herpes simplex    History of breast  cancer    HTN (hypertension)    IBS (irritable bowel syndrome)    Internal carotid artery dissection (HCC)    Low back strain    Migraine    Non-recurrent acute suppurative otitis media of both ears without spontaneous rupture of tympanic membranes    OSA (obstructive sleep apnea)    Osteoarthritis    RIGHT HAND   Osteoarthritis of left ankle    Osteoarthritis of right knee    Otitis media    Personal history of radiation therapy 2014   PONV (postoperative nausea and vomiting)    Pure hypercholesterolemia    Radiation 02/18/13-04/04/13   left breast   Seasonal allergic rhinitis    Situational stress    Tinnitus    Wears glasses     Past Surgical History:  Procedure Laterality Date   BACK SURGERY     BREAST LUMPECTOMY Left 12/31/2012   BREAST LUMPECTOMY WITH NEEDLE LOCALIZATION AND AXILLARY SENTINEL LYMPH NODE BX Left 12/31/2012   Procedure: LEFT BREAST LUMPECTOMY WITH NEEDLE LOCALIZATION AND LEFT AXILLARY SENTINEL LYMPH NODE BIOPSY;  Surgeon: Kandis Cocking, MD;  Location: [facility];  Service: General;  Laterality: Left;   COLONOSCOPY  2007   MASS EXCISION Right 08/09/2019   Procedure: EXCISION MUCOID TUMOR, DEBRIDEMENT DISTAL INTERPHALANGEAL RIGHT INDEX FINGER;  Surgeon: Cindee Salt, MD;  Location: [facility];  Service: Orthopedics;  Laterality: Right;  IV REGIONAL FOREARM BIER BLOCK   NASAL POLYP SURGERY     WISDOM TOOTH EXTRACTION       Social History:  The patient  reports that she has never smoked. She has never used smokeless tobacco. She reports current alcohol use. She reports that she does not use drugs.   Family History:  The patient's family history includes Brain cancer in her paternal grandfather; Breast cancer (age of onset: 66) in her cousin and cousin; Melanoma in her daughter; Uterine cancer in her mother.    ROS:  Please see the history of present illness. All other systems are reviewed and  Negative to the above problem except as  noted.    PHYSICAL EXAM: VS:  BP 136/78   Pulse 82   Ht 5\' 4"  (1.626 m)   Wt 165 lb 12.8 oz (75.2 kg)   SpO2 99%   BMI 28.46 kg/m   GEN: Well nourished, well developed, in no acute distress  HEENT: normal  Neck: no JVD, carotid bruit Cardiac: RRR; no murmur  No LE  edema  Respiratory:  clear to auscultation GI: soft, nontender  No hepatomegaly   EKG:  EKG is ordered today.  NSR   Echo   Jan 2024  1. Left ventricular ejection fraction, by estimation, is 55 to 60%. The  left ventricle has normal function. The left ventricle has no regional  wall motion abnormalities. Left ventricular diastolic parameters were  normal.   2. Right ventricular systolic function is normal. The right ventricular  size is normal. There is normal pulmonary artery systolic pressure.   3. The mitral valve is grossly normal. Trivial mitral valve  regurgitation.   4. The aortic valve is bicuspid. There is mild calcification of the  aortic valve. Aortic valve regurgitation is trivial.   5. Aortic dilatation noted. There is moderate dilatation of the ascending  aorta, measuring 45 mm.   6. The inferior vena cava is normal in size with greater than 50%  respiratory variability, suggesting right atrial pressure of 3 mmHg.   Comparison(s): No significant change from prior study. Ascending aorta  measured 44 mm 05/2021, now 45 mm, within expected variability.   Conclusion(s)/Recommendation(s): Bicuspid aortic valve with moderate  ascending aorta dilation of 45 mm.  No results found for: "CHOL", "TRIG", "HDL", "CHOLHDL", "VLDL", "LDLCALC", "LDLDIRECT"    Wt Readings from Last 3 Encounters:  07/31/23 165 lb 12.8 oz (75.2 kg)  06/07/23 165 lb (74.8 kg)  07/15/22 174 lb 6.4 oz (79.1 kg)      ASSESSMENT AND PLAN: 1  Bicuspid AV/aortic dilation.    Continueto follow closely   Aorta has been stable   AV with no AS, no significant AI  2  HTN  BP is under better control at home   Make sure she brings cuff  to clinic to confirm accuracy  3  HTN  Will set up for lipomed panel    Follow up in 1 year   Current medicines are reviewed at length with the patient today.  The patient does not have concerns regarding medicines.  Signed, Dietrich Pates, MD  07/31/2023 5:54 PM  [facility] [address] [place], [place], [place]  [address] Phone: [phone]; Fax: [phone]

## 2023-07-31 ENCOUNTER — Ambulatory Visit: Payer: BC Managed Care – PPO | Attending: Internal Medicine | Admitting: Internal Medicine

## 2023-07-31 VITALS — BP 136/78 | HR 82 | Ht 64.0 in | Wt 165.8 lb

## 2023-07-31 DIAGNOSIS — I77810 Thoracic aortic ectasia: Secondary | ICD-10-CM

## 2023-07-31 DIAGNOSIS — I700 Atherosclerosis of aorta: Secondary | ICD-10-CM

## 2023-07-31 DIAGNOSIS — Q2381 Bicuspid aortic valve: Secondary | ICD-10-CM | POA: Diagnosis not present

## 2023-07-31 DIAGNOSIS — E782 Mixed hyperlipidemia: Secondary | ICD-10-CM

## 2023-07-31 DIAGNOSIS — I719 Aortic aneurysm of unspecified site, without rupture: Secondary | ICD-10-CM | POA: Diagnosis not present

## 2023-07-31 DIAGNOSIS — I10 Essential (primary) hypertension: Secondary | ICD-10-CM | POA: Diagnosis not present

## 2023-07-31 DIAGNOSIS — E785 Hyperlipidemia, unspecified: Secondary | ICD-10-CM

## 2023-07-31 NOTE — Patient Instructions (Signed)
Medication Instructions:   *If you need a refill on your cardiac medications before your next appointment, please call your pharmacy*   Lab Work: NMR, HGBA1C, LIPO A, CMET, CBC IN JULY 2025 If you have labs (blood work) drawn today and your tests are completely normal, you will receive your results only by: MyChart Message (if you have MyChart) OR A paper copy in the mail If you have any lab test that is abnormal or we need to change your treatment, we will call you to review the results.   Testing/Procedures:    Follow-Up: At [facility], you and your health needs are our priority.  As part of our continuing mission to provide you with exceptional heart care, we have created designated Provider Care Teams.  These Care Teams include your primary Cardiologist (physician) and Advanced Practice Providers (APPs -  Physician Assistants and Nurse Practitioners) who all work together to provide you with the care you need, when you need it.  We recommend signing up for the patient portal called "MyChart".  Sign up information is provided on this After Visit Summary.  MyChart is used to connect with patients for Virtual Visits (Telemedicine).  Patients are able to view lab/test results, encounter notes, upcoming appointments, etc.  Non-urgent messages can be sent to your provider as well.   To learn more about what you can do with MyChart, go to [email or web address].    Your next appointment:  ONE YEAR

## 2023-09-18 ENCOUNTER — Other Ambulatory Visit: Payer: Self-pay | Admitting: Internal Medicine

## 2023-11-23 DIAGNOSIS — A6920 Lyme disease, unspecified: Secondary | ICD-10-CM | POA: Diagnosis not present

## 2023-12-07 ENCOUNTER — Telehealth: Payer: Self-pay | Admitting: Internal Medicine

## 2023-12-07 MED ORDER — METOPROLOL SUCCINATE ER 25 MG PO TB24: 25.0000 mg | ORAL_TABLET | Freq: Every day | ORAL | 1 refills | Status: DC

## 2023-12-07 NOTE — Telephone Encounter (Signed)
*  STAT* If patient is at the pharmacy, call can be transferred to refill team.   1. Which medications need to be refilled? (please list name of each medication and dose if known)   metoprolol  succinate (TOPROL -XL) 25 MG 24 hr tablet    2. Which pharmacy/location (including street and city if local pharmacy) is medication to be sent to? [facility] - [place], [place] - [address] Phone: [phone]  Fax: [phone]       Significant History/Details     3. Do they need a 30 day or 90 day supply? 90

## 2023-12-07 NOTE — Telephone Encounter (Signed)
 Pt's medication was sent to pt's pharmacy as requested. Confirmation received.

## 2024-04-18 LAB — COMPREHENSIVE METABOLIC PANEL WITH GFR
ALT: 20 IU/L (ref 0–32)
AST: 22 IU/L (ref 0–40)
Albumin: 4.3 g/dL (ref 3.9–4.9)
Alkaline Phosphatase: 67 IU/L (ref 49–135)
BUN/Creatinine Ratio: 20 (ref 12–28)
BUN: 15 mg/dL (ref 8–27)
Bilirubin Total: 0.5 mg/dL (ref 0.0–1.2)
CO2: 24 mmol/L (ref 20–29)
Calcium: 9.6 mg/dL (ref 8.7–10.3)
Chloride: 105 mmol/L (ref 96–106)
Creatinine, Ser: 0.74 mg/dL (ref 0.57–1.00)
Globulin, Total: 2.2 g/dL (ref 1.5–4.5)
Glucose: 90 mg/dL (ref 70–99)
Potassium: 4.7 mmol/L (ref 3.5–5.2)
Sodium: 141 mmol/L (ref 134–144)
Total Protein: 6.5 g/dL (ref 6.0–8.5)
eGFR: 88 mL/min/1.73 (ref >59)

## 2024-04-18 LAB — NMR, LIPOPROFILE
Cholesterol, Total: 178 mg/dL (ref 100–199)
HDL Particle Number: 45.1 umol/L (ref >=30.5)
HDL-C: 65 mg/dL (ref >39)
LDL Particle Number: 930 nmol/L (ref <1000)
LDL Size: 20.5 nm — AB (ref >20.5)
LDL-C (NIH Calc): 91 mg/dL (ref 0–99)
LP-IR Score: 46 — AB (ref <=45)
Small LDL Particle Number: 366 nmol/L (ref <=527)
Triglycerides: 129 mg/dL (ref 0–149)

## 2024-04-18 LAB — CBC
Hematocrit: 42.7 % (ref 34.0–46.6)
Hemoglobin: 14 g/dL (ref 11.1–15.9)
MCH: 30.1 pg (ref 26.6–33.0)
MCHC: 32.8 g/dL (ref 31.5–35.7)
MCV: 92 fL (ref 79–97)
Platelets: 232 x10E3/uL (ref 150–450)
RBC: 4.65 x10E6/uL (ref 3.77–5.28)
RDW: 12.5 % (ref 11.7–15.4)
WBC: 4.8 x10E3/uL (ref 3.4–10.8)

## 2024-04-18 LAB — LIPOPROTEIN A (LPA): Lipoprotein (a): 72.4 nmol/L (ref <75.0)

## 2024-04-18 LAB — HEMOGLOBIN A1C
Est. average glucose Bld gHb Est-mCnc: 114 mg/dL
Hgb A1c MFr Bld: 5.6 % (ref 4.8–5.6)

## 2024-04-19 ENCOUNTER — Ambulatory Visit: Payer: Self-pay | Admitting: Internal Medicine

## 2024-04-19 DIAGNOSIS — I719 Aortic aneurysm of unspecified site, without rupture: Secondary | ICD-10-CM

## 2024-05-28 ENCOUNTER — Other Ambulatory Visit: Payer: Self-pay | Admitting: Internal Medicine

## 2024-05-28 ENCOUNTER — Encounter: Payer: Self-pay | Admitting: Internal Medicine

## 2024-06-04 ENCOUNTER — Other Ambulatory Visit: Payer: Self-pay | Admitting: Internal Medicine

## 2024-06-04 DIAGNOSIS — Z1231 Encounter for screening mammogram for malignant neoplasm of breast: Secondary | ICD-10-CM

## 2024-06-05 ENCOUNTER — Telehealth: Payer: Self-pay | Admitting: Internal Medicine

## 2024-06-05 DIAGNOSIS — Q2381 Bicuspid aortic valve: Secondary | ICD-10-CM

## 2024-06-05 DIAGNOSIS — I719 Aortic aneurysm of unspecified site, without rupture: Secondary | ICD-10-CM

## 2024-06-05 NOTE — Telephone Encounter (Signed)
  Patient would like to know if she will need to get an Echo done before her next routine follow up

## 2024-06-05 NOTE — Telephone Encounter (Signed)
 No annual appointment scheduled.   Per last echo result note:    Avelina LITTIE Felix, RN 07/15/2022  8:50 AM EST     Patient saw Dr Dann today.  Echo planned for May 2025.  CTA of chest followed by [facility]   Avelina LITTIE Felix, RN 07/14/2022  5:44 PM EST     Patient has been notified. See phone note   Candyce GORMAN Dann, MD 07/12/2022  2:41 PM EST     Normal LV/RV function.   No significant change from prior study. Ascending aorta measured 44 mm 05/2021, now 45 mm, within expected variability.  Bicuspid aortic valve with moderate ascending aorta dilation of 45 mm.  Repeat echo in 1 year.  Also should be getting annual CTA of chest to eval aorta.  Last test was in 11/23.    CTA has already been ordered - not scheduled.   Routed to MD

## 2024-06-06 NOTE — Telephone Encounter (Signed)
 Left message informing patient Dr. Okey would like her to have an echo prior to follow-up appt in January.  Echo ordered. Provided office number for callback if any questions.

## 2024-06-12 ENCOUNTER — Other Ambulatory Visit: Payer: Self-pay | Admitting: Surgery

## 2024-06-12 DIAGNOSIS — I7121 Aneurysm of the ascending aorta, without rupture: Secondary | ICD-10-CM

## 2024-06-14 ENCOUNTER — Ambulatory Visit (HOSPITAL_COMMUNITY)
Admission: RE | Admit: 2024-06-14 | Discharge: 2024-06-14 | Disposition: A | Source: Ambulatory Visit | Attending: Surgery | Admitting: Surgery

## 2024-06-14 DIAGNOSIS — I7121 Aneurysm of the ascending aorta, without rupture: Secondary | ICD-10-CM

## 2024-06-14 MED ORDER — IOHEXOL 350 MG/ML SOLN
100.0000 mL | Freq: Once | INTRAVENOUS | Status: AC | PRN
  Administered 2024-06-14: 100 mL via INTRAVENOUS

## 2024-06-15 ENCOUNTER — Other Ambulatory Visit (HOSPITAL_BASED_OUTPATIENT_CLINIC_OR_DEPARTMENT_OTHER): Payer: Self-pay | Admitting: Internal Medicine

## 2024-06-15 DIAGNOSIS — M810 Age-related osteoporosis without current pathological fracture: Secondary | ICD-10-CM

## 2024-06-24 ENCOUNTER — Ambulatory Visit: Attending: Surgery | Admitting: Surgery

## 2024-06-24 ENCOUNTER — Encounter: Payer: Self-pay | Admitting: Surgery

## 2024-06-24 VITALS — BP 145/95 | HR 83 | Resp 20 | Ht 64.0 in | Wt 174.0 lb

## 2024-06-24 DIAGNOSIS — I7121 Aneurysm of the ascending aorta, without rupture: Secondary | ICD-10-CM | POA: Insufficient documentation

## 2024-06-25 NOTE — Progress Notes (Signed)
 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 She had a brother who died suddenly at age 41 of unexplained cause. She has another brother who has an aortic aneurysm that is over 5 cm and a bicuspid aortic valve.  I last saw her on 06/07/2023 and CTA of the chest at that time showed the sinus diameter to be 4.6 cm compared to 4.4 cm the year prior.  Her ascending aortic diameter was 4.3 cm.  We decided to continue following this since her aneurysm was still below the 5 cm surgical threshold for patients with bicuspid aortic valve.  Since I last saw her she has continued to feel well.  Current Outpatient Medications  Medication Sig Dispense Refill   ALPRAZolam (XANAX) 0.25 MG tablet Take 1 tablet by mouth 3 (three) times daily as needed. Patient states she only uses as needed     aspirin 81 MG chewable tablet Chew 81 mg by mouth daily.      atorvastatin (LIPITOR) 10 MG tablet Take 10 mg by mouth daily.     Cholecalciferol (VITAMIN D3) 1.25 MG (50000 UT) CAPS Take 1,000 Units by mouth daily.     fluticasone (FLONASE) 50 MCG/ACT nasal spray Place 1 spray into both nostrils daily.     Loratadine 10 MG CAPS Take 10 mg by mouth as needed.     metoprolol  succinate (TOPROL -XL) 25 MG 24 hr tablet Take 1 tablet (25 mg total) by mouth daily. Please make your yearly follow-up appointment in January 90 tablet 0   Multiple Vitamins-Minerals (CENTRUM SILVER 50+WOMEN PO) Take 1 tablet by mouth daily.     valsartan (DIOVAN) 80 MG tablet Take 80 mg by mouth daily.     No current facility-administered medications for this visit.     Physical Exam: BP (!) 145/95   Pulse 83   Resp 20   Ht 5' 4 (1.626 m)    Wt 174 lb (78.9 kg)   SpO2 97% Comment: RA  BMI 29.87 kg/m  She looks well. Cardiac exam shows regular rate and rhythm with normal heart sounds.  There is no murmur. Lungs are clear.  Diagnostic Tests:  Narrative & Impression  EXAM: CTA CHEST AORTA 06/14/2024 09:04:02 AM   TECHNIQUE: CTA of the chest was performed after the administration of 100 mL of iohexol  (OMNIPAQUE ) 350 MG/ML injection. Multiplanar reformatted images are provided for review. MIP images are provided for review. Automated exposure control, iterative reconstruction, and/or weight based adjustment of the mA/kV was utilized to reduce the radiation dose to as low as reasonably achievable.   COMPARISON: 05/31/2023   CLINICAL HISTORY: Aortic aneurysm suspected.   FINDINGS:   AORTA: Thoracic aortic 2.7 cm at the level of the valve, 4.6 cm at the level of the sinuses, and 4.4 cm mid ascending (previously 4.3 cm). The arch and descending segment are normal in caliber. Classic 3 vessel brachiocephalic arterial origin anatomy without proximal stenosis. Minimal calcified plaque in the descending segment. Visualized proximal abdominal aorta is normal in caliber. No thoracic aortic dissection.   MEDIASTINUM: No mediastinal lymphadenopathy. The heart and pericardium demonstrate no acute abnormality.  LYMPH NODES: No mediastinal, hilar or axillary lymphadenopathy.   LUNGS AND PLEURA: Stable linear scarring or subsegmental atelectasis medially at the left lung base. No focal consolidation or pulmonary edema. No pleural effusion or pneumothorax.   UPPER ABDOMEN: Limited images of the upper abdomen are unremarkable.   SOFT TISSUES AND BONES: Spondylotic changes in the upper lumbar spine. Process prominent. No acute soft tissue abnormality.   IMPRESSION: 1. 4.4 cm Ascending thoracic aortic aneurysm, minimally increased from 4.3 cm, with no acute aortic abnormality. ACR 2013 and SVS 2018  guidelines recommendation is surveillance imaging at a 61-month interval.   Electronically signed by: Katheleen Faes MD 06/14/2024 12:39 PM EST RP Workstation: [id number]    Impression:  Her aortic root diameter was measured at 4.6 cm which is unchanged from last year.  The ascending aortic diameter was measured at 4.4 cm which was up slightly from the previous year when it was 4.3 cm.  I have personally measured the CT scan this year and compared it to hers from last year and I do not think there has been any significant difference.  Her aorta is still below the 5.0 cm surgical threshold for patients with bicuspid aortic valve.  I reviewed the CTA images with her and answered all her questions.  I stressed the importance of continued good blood pressure control in preventing further enlargement and acute aortic dissection.  She is scheduled for a follow-up 2D echocardiogram on 07/19/2024.  Plan:  She will continue to follow-up with Dr. Okey and I will plan to see her back in 1 year with a CTA of the chest for aortic surveillance.  I spent 15 minutes performing this established patient evaluation and > 50% of this time was spent face to face counseling and coordinating the care of this patient's aortic aneurysm.    Dorise MARLA Fellers, MD [facility] [phone]       "

## 2024-07-08 ENCOUNTER — Ambulatory Visit
Admission: RE | Admit: 2024-07-08 | Discharge: 2024-07-08 | Disposition: A | Discharge status: Home / Self Care | Source: Ambulatory Visit | Attending: Internal Medicine | Admitting: Internal Medicine

## 2024-07-08 DIAGNOSIS — Z1231 Encounter for screening mammogram for malignant neoplasm of breast: Secondary | ICD-10-CM

## 2024-07-19 ENCOUNTER — Ambulatory Visit (HOSPITAL_COMMUNITY)
Admission: RE | Admit: 2024-07-19 | Discharge: 2024-07-19 | Disposition: A | Discharge status: Home / Self Care | Source: Ambulatory Visit | Attending: Internal Medicine | Admitting: Internal Medicine

## 2024-07-19 ENCOUNTER — Ambulatory Visit: Payer: Self-pay | Admitting: Internal Medicine

## 2024-07-19 DIAGNOSIS — I719 Aortic aneurysm of unspecified site, without rupture: Secondary | ICD-10-CM | POA: Diagnosis present

## 2024-07-19 DIAGNOSIS — Q2381 Bicuspid aortic valve: Secondary | ICD-10-CM | POA: Diagnosis present

## 2024-07-19 LAB — ECHOCARDIOGRAM COMPLETE
Area-P 1/2: 3.37 cm2
P 1/2 time: 127 ms
S' Lateral: 2.97 cm

## 2024-10-25 ENCOUNTER — Ambulatory Visit: Admitting: Internal Medicine
